# Patient Record
Sex: Male | Born: 1995 | Race: White | Hispanic: No | Marital: Married | Smoking: Current every day smoker
Health system: Southern US, Community
[De-identification: ages and names within clinical notes are randomized; demographics above are authoritative.]

---

## 2018-07-09 ENCOUNTER — Inpatient Hospital Stay (HOSPITAL_COMMUNITY): Payer: Medicaid Other

## 2018-07-09 ENCOUNTER — Emergency Department (HOSPITAL_COMMUNITY): Payer: Medicaid Other

## 2018-07-09 ENCOUNTER — Other Ambulatory Visit: Payer: Self-pay

## 2018-07-09 ENCOUNTER — Inpatient Hospital Stay (HOSPITAL_COMMUNITY)
Admission: EM | Admit: 2018-07-09 | Discharge: 2018-07-16 | DRG: 871 | Disposition: A | Discharge status: Home / Self Care | Payer: Medicaid Other | Attending: Internal Medicine | Admitting: Internal Medicine

## 2018-07-09 ENCOUNTER — Encounter (HOSPITAL_COMMUNITY): Payer: Self-pay

## 2018-07-09 DIAGNOSIS — J9600 Acute respiratory failure, unspecified whether with hypoxia or hypercapnia: Secondary | ICD-10-CM

## 2018-07-09 DIAGNOSIS — J9601 Acute respiratory failure with hypoxia: Secondary | ICD-10-CM | POA: Diagnosis present

## 2018-07-09 DIAGNOSIS — J9382 Other air leak: Secondary | ICD-10-CM | POA: Diagnosis present

## 2018-07-09 DIAGNOSIS — Z4682 Encounter for fitting and adjustment of non-vascular catheter: Secondary | ICD-10-CM | POA: Diagnosis not present

## 2018-07-09 DIAGNOSIS — R0602 Shortness of breath: Secondary | ICD-10-CM

## 2018-07-09 DIAGNOSIS — J690 Pneumonitis due to inhalation of food and vomit: Secondary | ICD-10-CM

## 2018-07-09 DIAGNOSIS — J939 Pneumothorax, unspecified: Secondary | ICD-10-CM

## 2018-07-09 DIAGNOSIS — J982 Interstitial emphysema: Secondary | ICD-10-CM | POA: Diagnosis present

## 2018-07-09 DIAGNOSIS — Q559 Congenital malformation of male genital organ, unspecified: Secondary | ICD-10-CM

## 2018-07-09 DIAGNOSIS — G92 Toxic encephalopathy: Secondary | ICD-10-CM | POA: Diagnosis present

## 2018-07-09 DIAGNOSIS — J9312 Secondary spontaneous pneumothorax: Secondary | ICD-10-CM | POA: Diagnosis present

## 2018-07-09 DIAGNOSIS — Z880 Allergy status to penicillin: Secondary | ICD-10-CM

## 2018-07-09 DIAGNOSIS — F17210 Nicotine dependence, cigarettes, uncomplicated: Secondary | ICD-10-CM | POA: Diagnosis present

## 2018-07-09 DIAGNOSIS — S270XXA Traumatic pneumothorax, initial encounter: Secondary | ICD-10-CM | POA: Diagnosis not present

## 2018-07-09 DIAGNOSIS — F0781 Postconcussional syndrome: Secondary | ICD-10-CM | POA: Diagnosis present

## 2018-07-09 DIAGNOSIS — Z20828 Contact with and (suspected) exposure to other viral communicable diseases: Secondary | ICD-10-CM | POA: Diagnosis present

## 2018-07-09 DIAGNOSIS — R6521 Severe sepsis with septic shock: Secondary | ICD-10-CM | POA: Diagnosis present

## 2018-07-09 DIAGNOSIS — A419 Sepsis, unspecified organism: Secondary | ICD-10-CM | POA: Diagnosis present

## 2018-07-09 DIAGNOSIS — R402332 Coma scale, best motor response, abnormal, at arrival to emergency department: Secondary | ICD-10-CM | POA: Diagnosis present

## 2018-07-09 DIAGNOSIS — F05 Delirium due to known physiological condition: Secondary | ICD-10-CM | POA: Diagnosis not present

## 2018-07-09 DIAGNOSIS — G40401 Other generalized epilepsy and epileptic syndromes, not intractable, with status epilepticus: Secondary | ICD-10-CM | POA: Diagnosis present

## 2018-07-09 DIAGNOSIS — J9811 Atelectasis: Secondary | ICD-10-CM

## 2018-07-09 DIAGNOSIS — R402112 Coma scale, eyes open, never, at arrival to emergency department: Secondary | ICD-10-CM | POA: Diagnosis present

## 2018-07-09 DIAGNOSIS — Z87820 Personal history of traumatic brain injury: Secondary | ICD-10-CM

## 2018-07-09 DIAGNOSIS — R402212 Coma scale, best verbal response, none, at arrival to emergency department: Secondary | ICD-10-CM | POA: Diagnosis present

## 2018-07-09 DIAGNOSIS — E876 Hypokalemia: Secondary | ICD-10-CM | POA: Diagnosis present

## 2018-07-09 DIAGNOSIS — R569 Unspecified convulsions: Secondary | ICD-10-CM | POA: Diagnosis present

## 2018-07-09 DIAGNOSIS — Z820 Family history of epilepsy and other diseases of the nervous system: Secondary | ICD-10-CM

## 2018-07-09 DIAGNOSIS — R739 Hyperglycemia, unspecified: Secondary | ICD-10-CM | POA: Diagnosis present

## 2018-07-09 DIAGNOSIS — G40901 Epilepsy, unspecified, not intractable, with status epilepticus: Secondary | ICD-10-CM | POA: Diagnosis present

## 2018-07-09 DIAGNOSIS — N5089 Other specified disorders of the male genital organs: Secondary | ICD-10-CM | POA: Diagnosis present

## 2018-07-09 DIAGNOSIS — J9383 Other pneumothorax: Secondary | ICD-10-CM

## 2018-07-09 DIAGNOSIS — N179 Acute kidney failure, unspecified: Secondary | ICD-10-CM | POA: Diagnosis present

## 2018-07-09 DIAGNOSIS — R4182 Altered mental status, unspecified: Secondary | ICD-10-CM

## 2018-07-09 DIAGNOSIS — J9311 Primary spontaneous pneumothorax: Secondary | ICD-10-CM | POA: Diagnosis not present

## 2018-07-09 DIAGNOSIS — Z23 Encounter for immunization: Secondary | ICD-10-CM

## 2018-07-09 DIAGNOSIS — Z781 Physical restraint status: Secondary | ICD-10-CM

## 2018-07-09 LAB — COMPREHENSIVE METABOLIC PANEL
ALT: 23 U/L (ref 0–44)
AST: 31 U/L (ref 15–41)
Albumin: 4.1 g/dL (ref 3.5–5.0)
Alkaline Phosphatase: 60 U/L (ref 38–126)
Anion gap: 20 — ABNORMAL HIGH (ref 5–15)
BUN: 18 mg/dL (ref 6–20)
CO2: 15 mmol/L — ABNORMAL LOW (ref 22–32)
Calcium: 8.8 mg/dL — ABNORMAL LOW (ref 8.9–10.3)
Chloride: 101 mmol/L (ref 98–111)
Creatinine, Ser: 1.37 mg/dL — ABNORMAL HIGH (ref 0.61–1.24)
GFR calc Af Amer: >60 mL/min (ref >60)
GFR calc non Af Amer: >60 mL/min (ref >60)
Glucose, Bld: 226 mg/dL — ABNORMAL HIGH (ref 70–99)
Potassium: 3.9 mmol/L (ref 3.5–5.1)
Sodium: 136 mmol/L (ref 135–145)
Total Bilirubin: 0.6 mg/dL (ref 0.3–1.2)
Total Protein: 6.6 g/dL (ref 6.5–8.1)

## 2018-07-09 LAB — POCT I-STAT 7, (LYTES, BLD GAS, ICA,H+H)
Acid-base deficit: 8 mmol/L — ABNORMAL HIGH (ref 0.0–2.0)
Acid-base deficit: 7 mmol/L — ABNORMAL HIGH (ref 0.0–2.0)
Acid-base deficit: 4 mmol/L — ABNORMAL HIGH (ref 0.0–2.0)
Acid-base deficit: 3 mmol/L — ABNORMAL HIGH (ref 0.0–2.0)
Acid-base deficit: 3 mmol/L — ABNORMAL HIGH (ref 0.0–2.0)
Bicarbonate: 20.7 mmol/L (ref 20.0–28.0)
Bicarbonate: 21.3 mmol/L (ref 20.0–28.0)
Bicarbonate: 23.1 mmol/L (ref 20.0–28.0)
Bicarbonate: 23 mmol/L (ref 20.0–28.0)
Bicarbonate: 22.9 mmol/L (ref 20.0–28.0)
Calcium, Ion: 1.18 mmol/L (ref 1.15–1.40)
Calcium, Ion: 1.17 mmol/L (ref 1.15–1.40)
Calcium, Ion: 1.13 mmol/L — ABNORMAL LOW (ref 1.15–1.40)
Calcium, Ion: 1.11 mmol/L — ABNORMAL LOW (ref 1.15–1.40)
Calcium, Ion: 1.11 mmol/L — ABNORMAL LOW (ref 1.15–1.40)
HCT: 42 % (ref 39.0–52.0)
HCT: 37 % — ABNORMAL LOW (ref 39.0–52.0)
HCT: 47 % (ref 39.0–52.0)
HCT: 45 % (ref 39.0–52.0)
HCT: 43 % (ref 39.0–52.0)
Hemoglobin: 14.3 g/dL (ref 13.0–17.0)
Hemoglobin: 12.6 g/dL — ABNORMAL LOW (ref 13.0–17.0)
Hemoglobin: 16 g/dL (ref 13.0–17.0)
Hemoglobin: 15.3 g/dL (ref 13.0–17.0)
Hemoglobin: 14.6 g/dL (ref 13.0–17.0)
O2 Saturation: 99 %
O2 Saturation: 78 %
O2 Saturation: 56 %
O2 Saturation: 84 %
O2 Saturation: 93 %
Patient temperature: 97.6
Patient temperature: 96.1
Patient temperature: 99.8
Patient temperature: 97.8
Patient temperature: 98.6
Potassium: 3.4 mmol/L — ABNORMAL LOW (ref 3.5–5.1)
Potassium: 3.3 mmol/L — ABNORMAL LOW (ref 3.5–5.1)
Potassium: 3.2 mmol/L — ABNORMAL LOW (ref 3.5–5.1)
Potassium: 3.2 mmol/L — ABNORMAL LOW (ref 3.5–5.1)
Potassium: 3.1 mmol/L — ABNORMAL LOW (ref 3.5–5.1)
Sodium: 137 mmol/L (ref 135–145)
Sodium: 139 mmol/L (ref 135–145)
Sodium: 136 mmol/L (ref 135–145)
Sodium: 136 mmol/L (ref 135–145)
Sodium: 136 mmol/L (ref 135–145)
TCO2: 22 mmol/L (ref 22–32)
TCO2: 23 mmol/L (ref 22–32)
TCO2: 25 mmol/L (ref 22–32)
TCO2: 24 mmol/L (ref 22–32)
TCO2: 24 mmol/L (ref 22–32)
pCO2 arterial: 50.2 mmHg — ABNORMAL HIGH (ref 32.0–48.0)
pCO2 arterial: 48.3 mmHg — ABNORMAL HIGH (ref 32.0–48.0)
pCO2 arterial: 50 mmHg — ABNORMAL HIGH (ref 32.0–48.0)
pCO2 arterial: 41.1 mmHg (ref 32.0–48.0)
pCO2 arterial: 42 mmHg (ref 32.0–48.0)
pH, Arterial: 7.22 — ABNORMAL LOW (ref 7.350–7.450)
pH, Arterial: 7.245 — ABNORMAL LOW (ref 7.350–7.450)
pH, Arterial: 7.276 — ABNORMAL LOW (ref 7.350–7.450)
pH, Arterial: 7.354 (ref 7.350–7.450)
pH, Arterial: 7.343 — ABNORMAL LOW (ref 7.350–7.450)
pO2, Arterial: 146 mmHg — ABNORMAL HIGH (ref 83.0–108.0)
pO2, Arterial: 46 mmHg — ABNORMAL LOW (ref 83.0–108.0)
pO2, Arterial: 35 mmHg — CL (ref 83.0–108.0)
pO2, Arterial: 49 mmHg — ABNORMAL LOW (ref 83.0–108.0)
pO2, Arterial: 70 mmHg — ABNORMAL LOW (ref 83.0–108.0)

## 2018-07-09 LAB — LACTIC ACID, PLASMA
Lactic Acid, Venous: 10.5 mmol/L (ref 0.5–1.9)
Lactic Acid, Venous: 1.7 mmol/L (ref 0.5–1.9)
Lactic Acid, Venous: 3.5 mmol/L (ref 0.5–1.9)
Lactic Acid, Venous: 3.6 mmol/L (ref 0.5–1.9)

## 2018-07-09 LAB — URINALYSIS, ROUTINE W REFLEX MICROSCOPIC
Bilirubin Urine: NEGATIVE
Glucose, UA: >=500 mg/dL — AB
Ketones, ur: NEGATIVE mg/dL
Leukocytes,Ua: NEGATIVE
Nitrite: NEGATIVE
Protein, ur: NEGATIVE mg/dL
Specific Gravity, Urine: 1.009 (ref 1.005–1.030)
pH: 5 (ref 5.0–8.0)

## 2018-07-09 LAB — GLUCOSE, CAPILLARY
Glucose-Capillary: 113 mg/dL — ABNORMAL HIGH (ref 70–99)
Glucose-Capillary: 122 mg/dL — ABNORMAL HIGH (ref 70–99)
Glucose-Capillary: 134 mg/dL — ABNORMAL HIGH (ref 70–99)

## 2018-07-09 LAB — RAPID URINE DRUG SCREEN, HOSP PERFORMED
Amphetamines: NOT DETECTED
Barbiturates: NOT DETECTED
Benzodiazepines: POSITIVE — AB
Cocaine: NOT DETECTED
Opiates: POSITIVE — AB
Tetrahydrocannabinol: POSITIVE — AB

## 2018-07-09 LAB — CBC
HCT: 46.9 % (ref 39.0–52.0)
Hemoglobin: 14.8 g/dL (ref 13.0–17.0)
MCH: 30.1 pg (ref 26.0–34.0)
MCHC: 31.6 g/dL (ref 30.0–36.0)
MCV: 95.5 fL (ref 80.0–100.0)
Platelets: 279 10*3/uL (ref 150–400)
RBC: 4.91 MIL/uL (ref 4.22–5.81)
RDW: 13 % (ref 11.5–15.5)
WBC: 10.3 10*3/uL (ref 4.0–10.5)
nRBC: 0 % (ref 0.0–0.2)

## 2018-07-09 LAB — TROPONIN I: Troponin I: 0.03 ng/mL (ref <0.03)

## 2018-07-09 LAB — BRAIN NATRIURETIC PEPTIDE: B Natriuretic Peptide: 31.4 pg/mL (ref 0.0–100.0)

## 2018-07-09 LAB — TRIGLYCERIDES: Triglycerides: 45 mg/dL (ref <150)

## 2018-07-09 LAB — CBG MONITORING, ED: Glucose-Capillary: 202 mg/dL — ABNORMAL HIGH (ref 70–99)

## 2018-07-09 LAB — MAGNESIUM: Magnesium: 2.1 mg/dL (ref 1.7–2.4)

## 2018-07-09 LAB — SARS CORONAVIRUS 2 BY RT PCR (HOSPITAL ORDER, PERFORMED IN ~~LOC~~ HOSPITAL LAB): SARS Coronavirus 2: NEGATIVE

## 2018-07-09 LAB — PHOSPHORUS: Phosphorus: 3.3 mg/dL (ref 2.5–4.6)

## 2018-07-09 LAB — PROCALCITONIN: Procalcitonin: 0.38 ng/mL

## 2018-07-09 LAB — CORTISOL: Cortisol, Plasma: 14.3 ug/dL

## 2018-07-09 LAB — ETHANOL: Alcohol, Ethyl (B): <10 mg/dL (ref <10)

## 2018-07-09 MED ORDER — VANCOMYCIN HCL IN DEXTROSE 1-5 GM/200ML-% IV SOLN
1000.0000 mg | Freq: Once | INTRAVENOUS | Status: AC
  Administered 2018-07-09: 1000 mg via INTRAVENOUS
  Filled 2018-07-09: qty 200

## 2018-07-09 MED ORDER — FENTANYL CITRATE (PF) 100 MCG/2ML IJ SOLN: 50.0000 ug | Freq: Once | INTRAMUSCULAR | Status: DC

## 2018-07-09 MED ORDER — PANTOPRAZOLE SODIUM 40 MG IV SOLR
40.0000 mg | Freq: Every day | INTRAVENOUS | Status: DC
  Administered 2018-07-09 – 2018-07-12 (×4): 40 mg via INTRAVENOUS
  Filled 2018-07-09 (×4): qty 40

## 2018-07-09 MED ORDER — SODIUM CHLORIDE 0.9 % IV SOLN
2000.0000 mg | Freq: Once | INTRAVENOUS | Status: AC
  Administered 2018-07-09: 2000 mg via INTRAVENOUS
  Filled 2018-07-09: qty 20

## 2018-07-09 MED ORDER — FENTANYL CITRATE (PF) 100 MCG/2ML IJ SOLN
INTRAMUSCULAR | Status: AC
  Filled 2018-07-09: qty 2

## 2018-07-09 MED ORDER — FENTANYL CITRATE (PF) 100 MCG/2ML IJ SOLN
100.0000 ug | INTRAMUSCULAR | Status: AC | PRN
  Administered 2018-07-09 (×3): 100 ug via INTRAVENOUS

## 2018-07-09 MED ORDER — INSULIN ASPART 100 UNIT/ML ~~LOC~~ SOLN
2.0000 [IU] | SUBCUTANEOUS | Status: DC
  Administered 2018-07-09: 2 [IU] via SUBCUTANEOUS

## 2018-07-09 MED ORDER — PROPOFOL 1000 MG/100ML IV EMUL
25.0000 ug/kg/min | INTRAVENOUS | Status: DC
  Administered 2018-07-09: 50 ug/kg/min via INTRAVENOUS
  Administered 2018-07-09: 60 ug/kg/min via INTRAVENOUS
  Administered 2018-07-10: 35 ug/kg/min via INTRAVENOUS
  Administered 2018-07-10: 50 ug/kg/min via INTRAVENOUS
  Administered 2018-07-10: 60 ug/kg/min via INTRAVENOUS
  Administered 2018-07-11: 70 ug/kg/min via INTRAVENOUS
  Administered 2018-07-11 (×2): 40 ug/kg/min via INTRAVENOUS
  Administered 2018-07-12 (×2): 65 ug/kg/min via INTRAVENOUS
  Filled 2018-07-09 (×13): qty 100

## 2018-07-09 MED ORDER — ACETAMINOPHEN 160 MG/5ML PO SOLN
650.0000 mg | ORAL | Status: DC | PRN
  Administered 2018-07-16: 650 mg
  Filled 2018-07-09: qty 20.3

## 2018-07-09 MED ORDER — SODIUM CHLORIDE 0.9 % IV SOLN: 0.0000 ug/kg/min | INTRAVENOUS | Status: DC

## 2018-07-09 MED ORDER — CISATRACURIUM BESYLATE (PF) 10 MG/5ML IV SOLN
0.1000 mg/kg | INTRAVENOUS | Status: DC | PRN
  Filled 2018-07-09 (×2): qty 3.2

## 2018-07-09 MED ORDER — CISATRACURIUM BOLUS VIA INFUSION: 5.0000 mg | Freq: Once | INTRAVENOUS | Status: DC

## 2018-07-09 MED ORDER — FENTANYL 2500MCG IN NS 250ML (10MCG/ML) PREMIX INFUSION
50.0000 ug/h | INTRAVENOUS | Status: DC
  Administered 2018-07-09: 200 ug/h via INTRAVENOUS
  Administered 2018-07-09 – 2018-07-12 (×7): 300 ug/h via INTRAVENOUS
  Filled 2018-07-09 (×8): qty 250

## 2018-07-09 MED ORDER — MIDAZOLAM HCL 2 MG/2ML IJ SOLN
INTRAMUSCULAR | Status: AC
  Filled 2018-07-09: qty 2

## 2018-07-09 MED ORDER — CHLORHEXIDINE GLUCONATE 0.12% ORAL RINSE (MEDLINE KIT)
15.0000 mL | Freq: Two times a day (BID) | OROMUCOSAL | Status: DC
  Administered 2018-07-09 – 2018-07-12 (×6): 15 mL via OROMUCOSAL

## 2018-07-09 MED ORDER — ATROPINE SULFATE 1 MG/10ML IJ SOSY
PREFILLED_SYRINGE | INTRAMUSCULAR | Status: AC
  Filled 2018-07-09: qty 10

## 2018-07-09 MED ORDER — MIDAZOLAM HCL 2 MG/2ML IJ SOLN
INTRAMUSCULAR | Status: AC
  Filled 2018-07-09: qty 6

## 2018-07-09 MED ORDER — PROPOFOL 1000 MG/100ML IV EMUL
0.0000 ug/kg/min | INTRAVENOUS | Status: DC
  Administered 2018-07-09: 10 ug/kg/min via INTRAVENOUS

## 2018-07-09 MED ORDER — MIDAZOLAM HCL 2 MG/2ML IJ SOLN
2.0000 mg | INTRAMUSCULAR | Status: DC | PRN
  Administered 2018-07-09: 2 mg via INTRAVENOUS
  Filled 2018-07-09 (×2): qty 2

## 2018-07-09 MED ORDER — FENTANYL BOLUS VIA INFUSION
50.0000 ug | INTRAVENOUS | Status: DC | PRN
  Filled 2018-07-09: qty 50

## 2018-07-09 MED ORDER — FENTANYL 2500MCG IN NS 250ML (10MCG/ML) PREMIX INFUSION: 50.0000 ug/h | INTRAVENOUS | Status: DC

## 2018-07-09 MED ORDER — ARTIFICIAL TEARS OPHTHALMIC OINT: 1.0000 "application " | TOPICAL_OINTMENT | Freq: Three times a day (TID) | OPHTHALMIC | Status: DC

## 2018-07-09 MED ORDER — SODIUM CHLORIDE 0.9 % IV SOLN
0.5000 ug/kg/min | INTRAVENOUS | Status: DC
  Administered 2018-07-09: 0.5 ug/kg/min via INTRAVENOUS
  Administered 2018-07-11: 2 ug/kg/min via INTRAVENOUS
  Filled 2018-07-09 (×2): qty 20

## 2018-07-09 MED ORDER — PROPOFOL 1000 MG/100ML IV EMUL: 25.0000 ug/kg/min | INTRAVENOUS | Status: DC

## 2018-07-09 MED ORDER — DEXTROSE 5 % IV SOLN
10.0000 mg/kg | Freq: Three times a day (TID) | INTRAVENOUS | Status: DC
  Administered 2018-07-09 – 2018-07-12 (×9): 635 mg via INTRAVENOUS
  Filled 2018-07-09 (×11): qty 12.7

## 2018-07-09 MED ORDER — SODIUM CHLORIDE 0.9 % IV BOLUS
1000.0000 mL | Freq: Once | INTRAVENOUS | Status: AC
  Administered 2018-07-09: 1000 mL via INTRAVENOUS

## 2018-07-09 MED ORDER — POTASSIUM CHLORIDE 10 MEQ/100ML IV SOLN
10.0000 meq | INTRAVENOUS | Status: AC
  Administered 2018-07-09 (×2): 10 meq via INTRAVENOUS
  Filled 2018-07-09 (×2): qty 100

## 2018-07-09 MED ORDER — SODIUM CHLORIDE 0.9 % IV SOLN
2.0000 g | Freq: Two times a day (BID) | INTRAVENOUS | Status: DC
  Administered 2018-07-10 – 2018-07-11 (×4): 2 g via INTRAVENOUS
  Filled 2018-07-09 (×4): qty 20

## 2018-07-09 MED ORDER — ROCURONIUM BROMIDE 50 MG/5ML IV SOLN
INTRAVENOUS | Status: AC | PRN
  Administered 2018-07-09: 75 mg via INTRAVENOUS

## 2018-07-09 MED ORDER — VALPROATE SODIUM 500 MG/5ML IV SOLN
2000.0000 mg | Freq: Once | INTRAVENOUS | Status: AC
  Administered 2018-07-09: 2000 mg via INTRAVENOUS
  Filled 2018-07-09: qty 20

## 2018-07-09 MED ORDER — ACETAMINOPHEN 325 MG PO TABS
650.0000 mg | ORAL_TABLET | ORAL | Status: DC | PRN
  Filled 2018-07-09: qty 2

## 2018-07-09 MED ORDER — SODIUM CHLORIDE 0.9 % IV SOLN
250.0000 mL | INTRAVENOUS | Status: DC
  Administered 2018-07-12: 05:00:00 250 mL via INTRAVENOUS

## 2018-07-09 MED ORDER — ONDANSETRON HCL 4 MG/2ML IJ SOLN
4.0000 mg | Freq: Four times a day (QID) | INTRAMUSCULAR | Status: DC | PRN
  Administered 2018-07-12: 10:00:00 4 mg via INTRAVENOUS
  Filled 2018-07-09: qty 2

## 2018-07-09 MED ORDER — FENTANYL CITRATE (PF) 100 MCG/2ML IJ SOLN: 100.0000 ug | INTRAMUSCULAR | Status: DC | PRN

## 2018-07-09 MED ORDER — ALBUTEROL SULFATE (2.5 MG/3ML) 0.083% IN NEBU: 2.5000 mg | INHALATION_SOLUTION | RESPIRATORY_TRACT | Status: DC | PRN

## 2018-07-09 MED ORDER — ORAL CARE MOUTH RINSE
15.0000 mL | OROMUCOSAL | Status: DC
  Administered 2018-07-09 – 2018-07-12 (×28): 15 mL via OROMUCOSAL

## 2018-07-09 MED ORDER — NOREPINEPHRINE 4 MG/250ML-% IV SOLN
INTRAVENOUS | Status: AC
  Administered 2018-07-09: 5 mg
  Filled 2018-07-09: qty 250

## 2018-07-09 MED ORDER — LEVETIRACETAM IN NACL 1000 MG/100ML IV SOLN
1000.0000 mg | Freq: Two times a day (BID) | INTRAVENOUS | Status: DC
  Administered 2018-07-09 – 2018-07-15 (×12): 1000 mg via INTRAVENOUS
  Filled 2018-07-09 (×12): qty 100

## 2018-07-09 MED ORDER — MIDAZOLAM HCL 2 MG/2ML IJ SOLN
2.0000 mg | INTRAMUSCULAR | Status: DC | PRN
  Administered 2018-07-09 (×2): 2 mg via INTRAVENOUS
  Filled 2018-07-09 (×2): qty 2

## 2018-07-09 MED ORDER — VANCOMYCIN HCL IN DEXTROSE 750-5 MG/150ML-% IV SOLN
750.0000 mg | Freq: Two times a day (BID) | INTRAVENOUS | Status: DC
  Administered 2018-07-10 – 2018-07-11 (×3): 750 mg via INTRAVENOUS
  Filled 2018-07-09 (×4): qty 150

## 2018-07-09 MED ORDER — FENTANYL BOLUS VIA INFUSION: 50.0000 ug | INTRAVENOUS | Status: DC | PRN

## 2018-07-09 MED ORDER — FUROSEMIDE 10 MG/ML IJ SOLN
40.0000 mg | Freq: Once | INTRAMUSCULAR | Status: AC
  Administered 2018-07-09: 12:00:00 40 mg via INTRAVENOUS
  Filled 2018-07-09: qty 4

## 2018-07-09 MED ORDER — ROCURONIUM BROMIDE 50 MG/5ML IV SOLN
INTRAVENOUS | Status: AC | PRN
  Administered 2018-07-09: 80 mg via INTRAVENOUS

## 2018-07-09 MED ORDER — SODIUM CHLORIDE 0.9 % IV SOLN
3.0000 g | Freq: Four times a day (QID) | INTRAVENOUS | Status: DC
  Filled 2018-07-09 (×4): qty 3

## 2018-07-09 MED ORDER — HEPARIN SODIUM (PORCINE) 5000 UNIT/ML IJ SOLN
5000.0000 [IU] | Freq: Three times a day (TID) | INTRAMUSCULAR | Status: DC
  Administered 2018-07-09 – 2018-07-13 (×10): 5000 [IU] via SUBCUTANEOUS
  Filled 2018-07-09 (×10): qty 1

## 2018-07-09 MED ORDER — SODIUM CHLORIDE 0.9 % IV SOLN: INTRAVENOUS | Status: DC | PRN

## 2018-07-09 MED ORDER — VALPROATE SODIUM 500 MG/5ML IV SOLN
350.0000 mg | Freq: Three times a day (TID) | INTRAVENOUS | Status: DC
  Administered 2018-07-10 – 2018-07-15 (×17): 350 mg via INTRAVENOUS
  Filled 2018-07-09 (×19): qty 3.5

## 2018-07-09 MED ORDER — ARTIFICIAL TEARS OPHTHALMIC OINT
1.0000 "application " | TOPICAL_OINTMENT | Freq: Three times a day (TID) | OPHTHALMIC | Status: DC
  Administered 2018-07-09 – 2018-07-11 (×6): 1 via OPHTHALMIC
  Filled 2018-07-09: qty 3.5

## 2018-07-09 MED ORDER — CISATRACURIUM BESYLATE (PF) 10 MG/5ML IV SOLN
10.0000 mg | Freq: Once | INTRAVENOUS | Status: AC
  Administered 2018-07-09: 10 mg via INTRAVENOUS

## 2018-07-09 MED ORDER — LORAZEPAM 2 MG/ML IJ SOLN
INTRAMUSCULAR | Status: AC
  Administered 2018-07-09: 1 mg
  Filled 2018-07-09: qty 1

## 2018-07-09 MED ORDER — FENTANYL CITRATE (PF) 100 MCG/2ML IJ SOLN
50.0000 ug | Freq: Once | INTRAMUSCULAR | Status: DC
  Filled 2018-07-09: qty 2

## 2018-07-09 MED ORDER — ROCURONIUM BROMIDE 50 MG/5ML IV SOLN
INTRAVENOUS | Status: AC | PRN
  Administered 2018-07-09: 50 mg via INTRAVENOUS

## 2018-07-09 MED ORDER — ETOMIDATE 2 MG/ML IV SOLN
INTRAVENOUS | Status: AC | PRN
  Administered 2018-07-09: 20 mg via INTRAVENOUS

## 2018-07-09 MED ORDER — NOREPINEPHRINE 4 MG/250ML-% IV SOLN
5.0000 ug/min | INTRAVENOUS | Status: DC
  Administered 2018-07-09: 23 ug/min via INTRAVENOUS
  Administered 2018-07-09: 18:00:00 26 ug/min via INTRAVENOUS
  Administered 2018-07-09: 25 ug/min via INTRAVENOUS
  Administered 2018-07-10: 11 ug/min via INTRAVENOUS
  Administered 2018-07-10: 4 ug/min via INTRAVENOUS
  Filled 2018-07-09 (×2): qty 250
  Filled 2018-07-09: qty 500
  Filled 2018-07-09: qty 250

## 2018-07-09 MED ORDER — MIDAZOLAM HCL 5 MG/5ML IJ SOLN
INTRAMUSCULAR | Status: AC | PRN
  Administered 2018-07-09: 5 mg via INTRAVENOUS

## 2018-07-09 MED ORDER — IOHEXOL 350 MG/ML SOLN
60.0000 mL | Freq: Once | INTRAVENOUS | Status: AC | PRN
  Administered 2018-07-09: 60 mL via INTRAVENOUS

## 2018-07-09 NOTE — Progress Notes (Signed)
Attempted to get patient this AM as EEG was ordered STAT by Dr. Otelia Limes however pt was unavailable due to chest tube being inserted and then central lines being placed. Went to get another pt's EEG, came back to pt's room and lines are still being placed however RN said it would only be a few more minutes then pt is being transferred to 4N31. Per Dr. Otelia Limes this pt is priority so I'm setting up in pt's new room and awaiting arrival.

## 2018-07-09 NOTE — ED Notes (Signed)
Pt's HR fell down to 49, Zoll pads in place. Pt still has strong femoral pulse. ICU MD in room. ICU MD to use bedside US.

## 2018-07-09 NOTE — Procedures (Signed)
Chest Tube Insertion Procedure Note  Indications:  Clinically significant right Pneumothorax  Pre-operative Diagnosis: Right Pneumothorax  Post-operative Diagnosis: Right Pneumothorax  Procedure Details  Informed consent was obtained for the procedure, including sedation.  Risks of lung perforation, hemorrhage, arrhythmia, and adverse drug reaction were discussed.   After sterile skin prep, using standard technique, a 28 French tube was placed in the right lateral 5 rib space.  Findings: Gush of air with air leak in chest  Estimated Blood Loss:  Minimal         Specimens:  None              Complications:  None; patient tolerated the procedure well.         Disposition: ICU - intubated and critically ill.         Condition: stable  Chilton Greathouse MD [facility] Pager [phone] If no answer call [phone] 07/09/2018, 3:34 PM
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
## 2018-07-09 NOTE — ED Notes (Signed)
ED TO INPATIENT HANDOFF REPORT  ED Nurse Name and Phone #: Caryn Bee RN  S Name/Age/Gender Chris Carter 22 y.o. male Room/Bed: OTFC/OTF  Code Status   Code Status: Full Code  Home/SNF/Other Home {Patient oriented to: intubated Is this baseline? 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 4 total witnessed seizures by the pt's coworker and EMS. EMS gave  of IM midazolam and  of IV midazolam in route. Pt appears to be in status epilepticus upon arrival. Snoring respirations, being bagged. HR 100, BP 140/80, spo2 92% with bvm, CBG 115. Pt had opa and 2 NPAs placed, then was intubated, then the pt was extubated in route. NPAs replaced and being bagged upon arrival here. Unknown if pt has hx of seizures. The pt's coworker stated that this was their first day working together. Pt was incontinent, no tongue trauma noted.    Allergies Allergies  Allergen Reactions  . Penicillins Other (See Comments)    Pt does not know reaction     Level of Care/Admitting Diagnosis ED Disposition    ED Disposition Condition Comment   Admit  Hospital Area: [facility] [[id number]]  Level of Care: ICU [6]  Covid Evaluation: Confirmed COVID Negative  Diagnosis: Status epilepticus [facility]) [[id number]]  Admitting Physician: Chilton Greathouse [[id number]]  Attending Physician: Chilton Greathouse [[id number]]  Estimated length of stay: 3 - 4 days  Certification:: I certify this patient will need inpatient services for at least 2 midnights  PT Class (Do Not Modify): Inpatient [101]  PT Acc Code (Do Not Modify): Private [1]       B Medical/Surgery History History reviewed. No pertinent past medical history. History reviewed. No pertinent surgical history.   A IV  Location/Drains/Wounds Patient Lines/Drains/Airways Status   Active Line/Drains/Airways    Name:   Placement date:   Placement time:   Site:   Days:   Peripheral IV 07/09/18 Left Antecubital   07/09/18    0921    Antecubital   less than 1   Peripheral IV 07/09/18 Right Antecubital   07/09/18    1101    Antecubital   less than 1   CVC Triple Lumen 07/09/18 Left External jugular   07/09/18    1250     less than 1   NG/OG Tube Orogastric 18 Fr. Center mouth Xray;Confirmed by Surgical Manipulation;Aucultation   07/09/18    2956    Center mouth   less than 1   Airway 7.5 mm   07/09/18    0920     less than 1          Intake/Output Last 24 hours  Intake/Output Summary (Last 24 hours) at 07/09/2018 1335 Last data filed at 07/09/2018 1130 Gross per 24 hour  Intake 2120 ml  Output -  Net 2120 ml    Labs/Imaging Results for orders placed or performed during the hospital encounter of 07/09/18 (from the past 48 hour(s))  SARS Coronavirus 2 (CEPHEID - Performed in [facility] hospital lab), Hosp Order     Status: None   Collection Time: 07/09/18  9:29 AM  Result Value Ref Range   SARS Coronavirus 2 NEGATIVE NEGATIVE    Comment: (NOTE) If result is NEGATIVE SARS-CoV-2 target nucleic acids  are NOT DETECTED. The SARS-CoV-2 RNA is generally detectable in upper and lower  respiratory specimens during the acute phase of infection. The lowest  concentration of SARS-CoV-2 viral copies this assay can detect is 250  copies / mL. A negative result does not preclude SARS-CoV-2 infection  and should not be used as the sole basis for treatment or other  patient management decisions.  A negative result may occur with  improper specimen collection / handling, submission of specimen other  than nasopharyngeal swab, presence of viral mutation(s) within the  areas targeted by this assay, and inadequate number of viral copies  (<250 copies / mL). A negative result must be combined with clinical   observations, patient history, and epidemiological information. If result is POSITIVE SARS-CoV-2 target nucleic acids are DETECTED. The SARS-CoV-2 RNA is generally detectable in upper and lower  respiratory specimens dur ing the acute phase of infection.  Positive  results are indicative of active infection with SARS-CoV-2.  Clinical  correlation with patient history and other diagnostic information is  necessary to determine patient infection status.  Positive results do  not rule out bacterial infection or co-infection with other viruses. If result is PRESUMPTIVE POSTIVE SARS-CoV-2 nucleic acids MAY BE PRESENT.   A presumptive positive result was obtained on the submitted specimen  and confirmed on repeat testing.  While 2019 novel coronavirus  (SARS-CoV-2) nucleic acids may be present in the submitted sample  additional confirmatory testing may be necessary for epidemiological  and / or clinical management purposes  to differentiate between  SARS-CoV-2 and other Sarbecovirus currently known to infect humans.  If clinically indicated additional testing with an alternate test  methodology [phone]) is advised. The SARS-CoV-2 RNA is generally  detectable in upper and lower respiratory sp ecimens during the acute  phase of infection. The expected result is Negative. Fact Sheet for Patients:  BoilerBrush.com.cy Fact Sheet for Healthcare Providers: https://pope.com/ This test is not yet approved or cleared by the [place] FDA and has been authorized for detection and/or diagnosis of SARS-CoV-2 by FDA under an Emergency Use Authorization (EUA).  This EUA will remain in effect (meaning this test can be used) for the duration of the COVID-19 declaration under Section 564(b)(1) of the Act, 21 U.S.C. section 360bbb-3(b)(1), unless the authorization is terminated or revoked sooner. Performed at [facility], [address]  [place], [place] [address]   CBG monitoring, ED     Status: Abnormal   Collection Time: 07/09/18  9:34 AM  Result Value Ref Range   Glucose-Capillary 202 (H) 70 - 99 mg/dL   Comment 1 Notify RN    Comment 2 Document in Chart   Rapid urine drug screen (hospital performed)     Status: Abnormal   Collection Time: 07/09/18  9:44 AM  Result Value Ref Range   Opiates POSITIVE (A) NONE DETECTED   Cocaine NONE DETECTED NONE DETECTED   Benzodiazepines POSITIVE (A) NONE DETECTED   Amphetamines NONE DETECTED NONE DETECTED   Tetrahydrocannabinol POSITIVE (A) NONE DETECTED   Barbiturates NONE DETECTED NONE DETECTED    Comment: (NOTE) DRUG SCREEN FOR MEDICAL PURPOSES ONLY.  IF CONFIRMATION IS NEEDED FOR ANY PURPOSE, NOTIFY LAB WITHIN 5 DAYS. LOWEST DETECTABLE LIMITS FOR URINE DRUG SCREEN Drug Class                     Cutoff (ng/mL) Amphetamine and metabolites    1000 Barbiturate and metabolites    200 Benzodiazepine  200 Tricyclics and metabolites     300 Opiates and metabolites        300 Cocaine and metabolites        300 THC                            50 Performed at [facility], [address], [place], [place] [address]   Ethanol     Status: None   Collection Time: 07/09/18  9:54 AM  Result Value Ref Range   Alcohol, Ethyl (B) <10 <10 mg/dL    Comment: (NOTE) Lowest detectable limit for serum alcohol is 10 mg/dL. For medical purposes only. Performed at [facility], [address] [place], [place] [address]   Comprehensive metabolic panel     Status: Abnormal   Collection Time: 07/09/18  9:55 AM  Result Value Ref Range   Sodium 136 135 - 145 mmol/L   Potassium 3.9 3.5 - 5.1 mmol/L   Chloride 101 98 - 111 mmol/L   CO2 15 (L) 22 - 32 mmol/L   Glucose, Bld 226 (H) 70 - 99 mg/dL   BUN 18 6 - 20 mg/dL   Creatinine, Ser 8.29 (H) 0.61 - 1.24 mg/dL   Calcium 8.8 (L) 8.9 - 10.3 mg/dL   Total Protein 6.6 6.5 - 8.1 g/dL   Albumin 4.1 3.5 - 5.0 g/dL   AST  31 15 - 41 U/L   ALT 23 0 - 44 U/L   Alkaline Phosphatase 60 38 - 126 U/L   Total Bilirubin 0.6 0.3 - 1.2 mg/dL   GFR calc non Af Amer >60 >60 mL/min   GFR calc Af Amer >60 >60 mL/min   Anion gap 20 (H) 5 - 15    Comment: Performed at [facility], [address] [place], [place] [address]  CBC     Status: None   Collection Time: 07/09/18  9:55 AM  Result Value Ref Range   WBC 10.3 4.0 - 10.5 K/uL   RBC 4.91 4.22 - 5.81 MIL/uL   Hemoglobin 14.8 13.0 - 17.0 g/dL   HCT 08.6 57.8 - 46.9 %   MCV 95.5 80.0 - 100.0 fL   MCH 30.1 26.0 - 34.0 pg   MCHC 31.6 30.0 - 36.0 g/dL   RDW 62.9 52.8 - 41.3 %   Platelets 279 150 - 400 K/uL   nRBC 0.0 0.0 - 0.2 %    Comment: Performed at [facility], [address], [place], [place] [address]  Triglycerides     Status: None   Collection Time: 07/09/18  9:55 AM  Result Value Ref Range   Triglycerides 45 <150 mg/dL    Comment: Performed at [facility], [address] [place], [place] [address]  I-STAT 7, (LYTES, BLD GAS, ICA, H+H)     Status: Abnormal   Collection Time: 07/09/18 10:03 AM  Result Value Ref Range   pH, Arterial 7.220 (L) 7.350 - 7.450   pCO2 arterial 50.2 (H) 32.0 - 48.0 mmHg   pO2, Arterial 146.0 (H) 83.0 - 108.0 mmHg   Bicarbonate 20.7 20.0 - 28.0 mmol/L   TCO2 22 22 - 32 mmol/L   O2 Saturation 99.0 %   Acid-base deficit 8.0 (H) 0.0 - 2.0 mmol/L   Sodium 137 135 - 145 mmol/L   Potassium 3.4 (L) 3.5 - 5.1 mmol/L   Calcium, Ion 1.18 1.15 - 1.40 mmol/L  HCT 42.0 39.0 - 52.0 %   Hemoglobin 14.3 13.0 - 17.0 g/dL   Patient temperature 53.6 F    Collection site RADIAL, ALLEN'S TEST ACCEPTABLE    Drawn by Operator    Sample type ARTERIAL   Lactic acid, plasma     Status: Abnormal   Collection Time: 07/09/18 10:50 AM  Result Value Ref Range   Lactic Acid, Venous 10.5 (HH) 0.5 - 1.9 mmol/L    Comment: CRITICAL RESULT CALLED TO, READ BACK BY AND VERIFIED WITH: K.Kasra Melvin,RN @ 1128 07/09/2018 WEBBERJ Performed at  [facility], [address] [place], [place] [address]   I-STAT 7, (LYTES, BLD GAS, ICA, H+H)     Status: Abnormal   Collection Time: 07/09/18 11:32 AM  Result Value Ref Range   pH, Arterial 7.245 (L) 7.350 - 7.450   pCO2 arterial 48.3 (H) 32.0 - 48.0 mmHg   pO2, Arterial 46.0 (L) 83.0 - 108.0 mmHg   Bicarbonate 21.3 20.0 - 28.0 mmol/L   TCO2 23 22 - 32 mmol/L   O2 Saturation 78.0 %   Acid-base deficit 7.0 (H) 0.0 - 2.0 mmol/L   Sodium 139 135 - 145 mmol/L   Potassium 3.3 (L) 3.5 - 5.1 mmol/L   Calcium, Ion 1.17 1.15 - 1.40 mmol/L   HCT 37.0 (L) 39.0 - 52.0 %   Hemoglobin 12.6 (L) 13.0 - 17.0 g/dL   Patient temperature 47.4 F    Collection site RADIAL, ALLEN'S TEST ACCEPTABLE    Drawn by Operator    Sample type ARTERIAL    Ct Head Wo Contrast  Result Date: 07/09/2018 CLINICAL DATA:  Seizure activity, new EXAM: CT HEAD WITHOUT CONTRAST TECHNIQUE: Contiguous axial images were obtained from the base of the skull through the vertex without intravenous contrast. COMPARISON:  None. FINDINGS: Brain: No evidence of acute infarction, hemorrhage, hydrocephalus, extra-axial collection or mass lesion/mass effect. Vascular: No hyperdense vessel or unexpected calcification. Skull: Normal. Negative for fracture or focal lesion. Sinuses/Orbits: The visualized paranasal sinuses are essentially clear. The mastoid air cells are unopacified. Other: None. IMPRESSION: Normal head CT. Electronically Signed   By: Charline Bills M.D.   On: 07/09/2018 10:30   Dg Chest Portable 1 View  Result Date: 07/09/2018 CLINICAL DATA:  Respiratory difficulty EXAM: PORTABLE CHEST 1 VIEW COMPARISON:  Today 924 hours FINDINGS: NG tube tip is now beyond the gastroesophageal junction. Endotracheal tube is stable. There is now linear lucency along the right side of the mediastinum extending towards the right supraclavicular region worrisome for pneumomediastinum. There is subcutaneous emphysema in the right side of the  neck. No definite pneumothorax can be visualized. Airspace disease throughout the left lung is worse. Central right airspace disease has developed. IMPRESSION: There is now emphysema in the right neck as well as findings suggesting pneumomediastinum. No definite pneumothorax is identified. Endotracheal tube is stable. NG tube tip is now in the stomach Worsening airspace disease throughout the left lung. New central right lung airspace disease. Electronically Signed   By: Jolaine Click M.D.   On: 07/09/2018 11:47   Dg Chest Port 1 View  Result Date: 07/09/2018 CLINICAL DATA:  Endotracheal tube insertion EXAM: PORTABLE CHEST 1 VIEW COMPARISON:  None. FINDINGS: Endotracheal tube tip is 4.2 cm from the carina. The NG tube is coiled in the proximal esophagus. Hazy airspace disease at the left lung base. Right lung is clear. Normal heart size. No pneumothorax. IMPRESSION: Endotracheal tube tip is 4.2 cm from the carina. NG tube is  coiled in the upper esophagus. Hazy left basilar airspace disease. Electronically Signed   By: Jolaine ClickArthur  Hoss M.D.   On: 07/09/2018 10:03   Dg Abd Portable 1 View  Result Date: 07/09/2018 CLINICAL DATA:  NG tube EXAM: PORTABLE ABDOMEN - 1 VIEW COMPARISON:  None. FINDINGS: NG tube tip is in the body of the stomach. There are no disproportionally dilated loops of bowel. There is no obvious free intraperitoneal gas. IMPRESSION: NG tube tip is in the body of the stomach. Electronically Signed   By: Jolaine ClickArthur  Hoss M.D.   On: 07/09/2018 10:08    Pending Labs Unresulted Labs (From admission, onward)    Start     Ordered   07/10/18 0500  CBC  Tomorrow morning,   R     07/09/18 1224   07/10/18 0500  Basic metabolic panel  Tomorrow morning,   R     07/09/18 1224   07/10/18 0500  Blood gas, arterial  Tomorrow morning,   R     07/09/18 1224   07/10/18 0500  Magnesium  Tomorrow morning,   R     07/09/18 1224   07/10/18 0500  Phosphorus  Tomorrow morning,   R     07/09/18 1224   07/10/18  0500  Procalcitonin  Daily,   R     07/09/18 1232   07/10/18 0500  Triglycerides  (propofol (DIPRIVAN))  Daily,   R    Comments:  While on propofol (DIPRIVAN)    07/09/18 1239   07/09/18 1243  Blood gas, arterial  Once,   R     07/09/18 1243   07/09/18 1217  Troponin I - Once  Once,   R     07/09/18 1224   07/09/18 1214  Magnesium  Once,   R     07/09/18 1224   07/09/18 1214  Phosphorus  Once,   R     07/09/18 1224   07/09/18 1214  Lactic acid, plasma  STAT Now then every 3 hours,   R     07/09/18 1224   07/09/18 1214  Procalcitonin  Once,   R     07/09/18 1224   07/09/18 1214  Brain natriuretic peptide  Once,   R     07/09/18 1224   07/09/18 1214  Cortisol  ONCE - STAT,   STAT     07/09/18 1224   07/09/18 1214  Culture, blood (routine x 2)  BLOOD CULTURE X 2,   R     07/09/18 1224   07/09/18 1214  Culture, respiratory (tracheal aspirate)  Once,   R     07/09/18 1224   07/09/18 1214  Urinalysis, Routine w reflex microscopic  Once,   R     07/09/18 1224   07/09/18 1214  Strep pneumoniae urinary antigen  ([facility])  Once,   R     07/09/18 1224   07/09/18 1214  Legionella Pneumophila Serogp 1 Ur Ag  Once,   R     07/09/18 1224   07/09/18 1212  HIV antibody (Routine Testing)  Once,   R     07/09/18 1224   07/09/18 1212  CBC  (heparin)  Once,   R    Comments:  Baseline for heparin therapy IF NOT ALREADY DRAWN.  Notify MD if PLT < 100 K.    07/09/18 1224   07/09/18 1212  Creatinine, serum  (heparin)  Once,   R    Comments:  Baseline for heparin therapy IF NOT ALREADY DRAWN.    07/09/18 1224   07/09/18 1050  Lactic acid, plasma  Now then every 2 hours,   STAT     07/09/18 1049   07/09/18 0925  Triglycerides  (propofol (DIPRIVAN))  Every 72 hours,   R    Comments:  While on propofol (DIPRIVAN)    07/09/18 0924          Vitals/Pain Today's Vitals   07/09/18 1205 07/09/18 1209 07/09/18 1245 07/09/18 1315  BP: (!) 165/98  (!) 143/86 (!) 151/87  Pulse: (!) 123 70 82  (!) 108  Resp: (!) 25 (!) 27  20  Temp:      TempSrc:      SpO2: (!) 84% (!) 81% (!) 87% (!) 83%  Weight:      Height:      PainSc:        Isolation Precautions No active isolations  Medications Medications  fentaNYL (SUBLIMAZE) injection 100 mcg (100 mcg Intravenous Given 07/09/18 1115)  fentaNYL (SUBLIMAZE) injection 100 mcg (has no administration in time range)  midazolam (VERSED) injection 2 mg (2 mg Intravenous Given 07/09/18 1227)  midazolam (VERSED) injection 2 mg (has no administration in time range)  fentaNYL (SUBLIMAZE) 100 MCG/2ML injection ( Intravenous Not Given 07/09/18 1100)  fentaNYL (SUBLIMAZE) 100 MCG/2ML injection (has no administration in time range)  midazolam (VERSED) 2 MG/2ML injection (has no administration in time range)  levETIRAcetam (KEPPRA) IVPB 1000 mg/100 mL premix (has no administration in time range)  heparin injection 5,000 Units (has no administration in time range)  pantoprazole (PROTONIX) injection 40 mg (has no administration in time range)  acetaminophen (TYLENOL) tablet 650 mg (has no administration in time range)  ondansetron (ZOFRAN) injection 4 mg (has no administration in time range)  0.9 %  sodium chloride infusion (has no administration in time range)  norepinephrine (LEVOPHED) 4mg  in premix infusion (has no administration in time range)  albuterol (PROVENTIL) (2.5 MG/3ML) 0.083% nebulizer solution 2.5 mg (has no administration in time range)  cefTRIAXone (ROCEPHIN) 2 g in sodium chloride 0.9 % 100 mL IVPB (has no administration in time range)  midazolam (VERSED) 2 MG/2ML injection (has no administration in time range)  potassium chloride 10 mEq in 100 mL IVPB (has no administration in time range)  vancomycin (VANCOCIN) IVPB 1000 mg/200 mL premix (has no administration in time range)  acyclovir (ZOVIRAX) 635 mg in dextrose 5 % 100 mL IVPB (has no administration in time range)  Ampicillin-Sulbactam (UNASYN) 3 g in sodium chloride 0.9  % 100 mL IVPB (0 g Intravenous Hold 07/09/18 1313)  insulin aspart (novoLOG) injection 2-6 Units (has no administration in time range)  vancomycin (VANCOCIN) IVPB 750 mg/150 ml premix (has no administration in time range)  artificial tears (LACRILUBE) ophthalmic ointment 1 application (has no administration in time range)  fentaNYL (SUBLIMAZE) injection 50 mcg (has no administration in time range)  fentaNYL in NS (40mcg/ml) infusion-PREMIX (has no administration in time range)  fentaNYL (SUBLIMAZE) bolus via infusion 50 mcg (has no administration in time range)  propofol (DIPRIVAN) 1000 MG/100ML infusion (has no administration in time range)  cisatracurium (NIMBEX) injection 6.4 mg (has no administration in time range)  0.9 %  sodium chloride infusion (has no administration in time range)  rocuronium (ZEMURON) injection (80 mg Intravenous Given 07/09/18 1232)  levETIRAcetam (KEPPRA) 2,000 mg in sodium chloride 0.9 % 100 mL IVPB (0 mg Intravenous Stopped 07/09/18 1008)  sodium  chloride 0.9 % bolus 1,000 mL (0 mLs Intravenous Stopped 07/09/18 0956)  etomidate (AMIDATE) injection (20 mg Intravenous Given 07/09/18 0917)  rocuronium (ZEMURON) injection (75 mg Intravenous Given 07/09/18 0918)  LORazepam (ATIVAN) 2 MG/ML injection (1 mg  Given 07/09/18 1045)  rocuronium (ZEMURON) injection (50 mg Intravenous Given 07/09/18 1116)  midazolam (VERSED) 5 MG/5ML injection (5 mg Intravenous Given 07/09/18 1118)  norepinephrine (LEVOPHED) 4-5 MG/250ML-% infusion SOLN (10 mcg/kg/min  Rate/Dose Change 07/09/18 1140)  sodium chloride 0.9 % bolus 1,000 mL (0 mLs Intravenous Stopped 07/09/18 1130)  furosemide (LASIX) injection 40 mg (40 mg Intravenous Given 07/09/18 1217)    Mobility non-ambulatory High fall risk   Focused Assessments Cardiac Assessment Handoff:  Cardiac Rhythm: Normal sinus rhythm No results found for: CKTOTAL, CKMB, CKMBINDEX, TROPONINI No results found for: DDIMER Does the Patient  currently have chest pain? No   , Neuro Assessment Handoff:  Swallow screen pass? Yes  Cardiac Rhythm: Normal sinus rhythm       Neuro Assessment: Exceptions to WDL Neuro Checks:      Last Documented NIHSS Modified Score:   Has TPA been given? No If patient is a Neuro Trauma and patient is going to OR before floor call report to 4N Charge nurse: [phone] or [phone]  , Pulmonary Assessment Handoff:  Lung sounds: Bilateral Breath Sounds: Clear L Breath Sounds: Rhonchi R Breath Sounds: Rhonchi O2 Device: Ventilator        R Recommendations: See Admitting Provider Note  Report given to: 4N RN  Additional Notes: see triage note

## 2018-07-09 NOTE — ED Notes (Addendum)
Pt starting to move, unable to tell if it is seizure activity or agitation. Pt's BP dropped to 90/64 then to 78 systolic, Propofol stopped. Dr Rhunette Croft in room, giving order for soft restraints. 1 L bolus started. 1mg  of ativan ordered and given.

## 2018-07-09 NOTE — ED Notes (Signed)
RT attempting arterial line. Pt began moving. ICU MD notified and ordered to restart propofol and leave levophed at .

## 2018-07-09 NOTE — Progress Notes (Signed)
ABG's serially improving.  Wife called, and updated regarding critical condition, placement of chest tubes for PTX

## 2018-07-09 NOTE — ED Notes (Signed)
Pt violently trying to pull at tubes. 2 RNs, RT and 1 EMT restraining pt and pt in soft wrist and ankle restraints. fentanyl given, now pt is calm.

## 2018-07-09 NOTE — ED Notes (Addendum)
Pt's wife came back to visit pt briefly prior to going to CT and upstairs. She told this RN that no one is allowed have  Information about the pt except her. The pt's mother called to speak with this RN to get an update but wife refused.  She will set up a pass code with RN upstairs after patient  Goes up and 4N RN aware of this. This RN escorting pt to CT and then to 4N at this time. Pt is on 50 propofol and still on 10 of levo. Blood cultures were drawn and this RN was about to start ampicillen but wife stated that he is allergic to all cillens. Not given, no other antibiotics available to give at this time.
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
## 2018-07-09 NOTE — ED Notes (Signed)
Dr Rhunette Croft spoke with pt's wife. She states that he has no hx of seizures. States that the pt had a headache yesterday and that is abnormal for him. He took a vicodin.

## 2018-07-09 NOTE — ED Triage Notes (Addendum)
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 4 total witnessed seizures by the pt's coworker and EMS. EMS gave 5mg  of IM midazolam and 5mg  of IV midazolam in route. Pt appears to be in status epilepticus upon arrival. Snoring respirations, being bagged. HR 100, BP 140/80, spo2 92% with bvm, CBG 115. Pt had opa and 2 NPAs placed, then was intubated, then the pt was extubated in route. NPAs replaced and being bagged upon arrival here. Unknown if pt has hx of seizures. The pt's coworker stated that this was their first day working together. Pt was incontinent, no tongue trauma noted.

## 2018-07-09 NOTE — Progress Notes (Signed)
Spoke with radiologist re: most recent CXR. L PTX still fairly persistent over hemidiaphragm;  apical component of PTX appears improved. L lung still quite consolidated.  As pt's ABG and SaO2 have continued to improve, and he remains hemodynamically stable, will  see if PEEP re-recruits lung and f/u in a.m. with repeat CXR. IF not, may require bronch w/ lavage and/or placement of 2nd chest tube.

## 2018-07-09 NOTE — Progress Notes (Signed)
rEEG turned LTM - no initial skin breakdown

## 2018-07-09 NOTE — Progress Notes (Addendum)
LTM EEG reviewed through 5:15 PM. After valproic acid load was completed 3:54 PM, no further episodes of evolving rhythmic delta activity suspicious for seizures are seen. There is prominent high amplitude fast beta activity throughout the record, most likely due to benzodiazepine effect.    A/R: 23 year old male presenting with status epilepticus after onset of headache at home - Continue scheduled Keppra and valproic acid - Frequent neuro checks - Continue empiric acyclovir, vancomycin and Rocephin - Continue LTM EEG - May be able to obtain MRI brain tomorrow if LTM EEG continues to show no seizure recurrence - No change to other recommendations made in my initial consult note.   Additional 10 minutes of critical care time spent in LTM EEG review  Electronically signed: Dr. Caryl Pina

## 2018-07-09 NOTE — Progress Notes (Signed)
RT note: RT and RN transported patient to CT and back to ED on ventilator. Vital signs stable through out.

## 2018-07-09 NOTE — ED Notes (Signed)
Pt now calm after med admin. ICU, Neuro at bedside. spo2 at 78%

## 2018-07-09 NOTE — Progress Notes (Signed)
EEG complete - results pending 

## 2018-07-09 NOTE — ED Notes (Addendum)
Central Line cart at bedside for placement by ICU MD. Holding off on LP right now due to low spo2 saturation. Pt will get central line, then go to ICU and to CT angio on the way.

## 2018-07-09 NOTE — Progress Notes (Signed)
Critical ABG values given to Dr. Ollen Bowl.

## 2018-07-09 NOTE — Procedures (Signed)
Central Venous Catheter Insertion Procedure Note WESTAN DRESEN [id number] [birth year]  Procedure: Insertion of Central Venous Catheter Indications: Drug and/or fluid administration  Procedure Details Consent: Risks of procedure as well as the alternatives and risks of each were explained to the (patient/caregiver).  Consent for procedure obtained. Time Out: Verified patient identification, verified procedure, site/side was marked, verified correct patient position, special equipment/implants available, medications/allergies/relevent history reviewed, required imaging and test results available.  Performed  Maximum sterile technique was used including antiseptics, cap, gloves, hand hygiene, mask and sheet. Skin prep: Chlorhexidine; local anesthetic administered A antimicrobial bonded/coated triple lumen catheter was placed in the left internal jugular vein using the Seldinger technique.  Evaluation Blood flow good Complications: No apparent complications Patient did tolerate procedure well. Chest X-ray ordered to verify placement.  CXR: pending.  Ceylon Arenson 07/09/2018, 3:28 PM

## 2018-07-09 NOTE — ED Notes (Signed)
Pt transported to CT by this RN and RT  
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
## 2018-07-09 NOTE — Progress Notes (Signed)
Pharmacy Antibiotic Note  Chris Carter is a 23 y.o. male admitted on 07/09/2018 with seizures. Pharmacy has been consulted for vancomycin, unasyn and acyclovir dosing. Pt is afebrile and WBC is WNL. SCr is elevated at 1.37 but unknown baseline at this time. Lactic acid is elevated  Plan: Vancomycin 1gm IV x 1 then 750mg  IV Q12H Unasyn 3gm IV Q6H Acyclovir 10mg /kg IV Q8H F/u renal fxn, C&S, clinical status and peak/trough at SS  Height: 5\' 10"  (177.8 cm) Weight: 140 lb (63.5 kg) IBW/kg (Calculated) : 73  Temp (24hrs), Avg:96.1 F (35.6 C), Min:96.1 F (35.6 C), Max:96.1 F (35.6 C)  Recent Labs  Lab 07/09/18 0955 07/09/18 1050  WBC 10.3  --   CREATININE 1.37*  --   LATICACIDVEN  --  10.5*    Estimated Creatinine Clearance: 77.3 mL/min (A) (by C-G formula based on SCr of 1.37 mg/dL (H)).    Allergies not on file  Antimicrobials this admission: Vanc 5/30>> CTX 5/30>> Acyclovir 5/30>> Unasyn 5/30>>  Dose adjustments this admission: N/A  Microbiology results: Pending  Thank you for allowing pharmacy to be a part of this patient's care.  Chris Carter, Drake Leach 07/09/2018 12:34 PM

## 2018-07-09 NOTE — Procedures (Signed)
Arterial Catheter Insertion Procedure Note ZALMAN GANSER [id number] [birth year]  Procedure: Insertion of Arterial Catheter  Indications: Blood pressure monitoring and Frequent blood sampling  Procedure Details Consent: Unable to obtain consent because of emergent medical necessity. Time Out: Verified patient identification, verified procedure, site/side was marked, verified correct patient position, special equipment/implants available, medications/allergies/relevent history reviewed, required imaging and test results available.  Performed  Maximum sterile technique was used including antiseptics, cap, gloves, gown, mask and sheet. Skin prep: Chlorhexidine; local anesthetic administered 20 gauge catheter was inserted into right radial artery using the Seldinger technique. ULTRASOUND GUIDANCE USED: NO Evaluation Blood flow good; BP tracing good. Complications: No apparent complications.   Morley Kos 07/09/2018

## 2018-07-09 NOTE — Progress Notes (Signed)
EEG preliminary interpretation: Background of fast beta activity. Rhythmic frontal slow wave activity develops approximately 15 minutes into the record and recurs intermittently. The findings are concerning for possible electrographic seizures versus long runs of FIRDA - the latter is considered to be less likely due to the evolving contour of the discharges.   A/R: 24 year old male with possible status epilepticus -- Loading with valproic acid 2000 mg x 1 -- Will start scheduled valproic acid 5 mg/kg IV TID -- Continue Keppra -- Switching to LTM EEG. -- If continued findings suspicious for electrographic seizure are seen, will start burst suppression protocol with Versed gtt  Additional 10 minutes of critical care time spent in EEG review.   Electronically signed: Dr. Caryl Pina

## 2018-07-09 NOTE — Procedures (Signed)
Procedure Note EMIGDIO DUTY [id number] 1996/02/03  Procedure: left side chest tube Indications: expanding pneumothorax in the setting of PPV  Procedure Details Consent: Unable to obtain consent because of emergent medical necessity. Time Out: Verified patient identification, verified procedure, site/side was marked, verified correct patient position, special equipment/implants available, medications/allergies/relevent history reviewed, required imaging and test results available.  Performed  Drugs:  100 mcg Fentanyl,  ~7th intercostal space incised after chloraprep skin prep and sterile drape. Blunt entry with escape of subcutaneous emphysema at deeper tissue layers, then on entry with kelly forceps into pleural space. 28 Fr chest tube entered easily. Hooked up to pleura-vac. 0-silk U sutures placed to fix tube in place, vaseline gauze and sterile dressing  Evaluation Hemodynamic Status: stable throughout; O2 sats: transiently improved, then started to decline.R PNA noted on f/u x-ray and decision made to place right CT. See Dr. Shirlee More procedure note Patient's Current Condition: critical Complications: No apparent complications Patient did tolerate procedure well. Chest X-ray ordered to verify placement.  CXR: tube position acceptable.  Gwynne Edinger, MD, PhD 07/09/18 3:26 PM

## 2018-07-09 NOTE — Procedures (Signed)
Chris A. Chris Laughter, MD     [email or web address]           HISTORY: This is a 23 year old who presents with new onset seizures.  The events occurred after the patient took opioids for severe headaches.  MEDICATIONS:  Current Facility-Administered Medications:  .  0.9 %  sodium chloride infusion, 250 mL, Intravenous, Continuous, Carter, Chris S, NP .  Place/Maintain arterial line, , , Until Discontinued **AND** 0.9 %  sodium chloride infusion, , Intra-arterial, PRN, Carter, Praveen, MD .  acetaminophen (TYLENOL) tablet 650 mg, 650 mg, Oral, Q4H PRN, Carter, Chris S, NP .  acyclovir (ZOVIRAX) 635 mg in dextrose 5 % 100 mL IVPB, 10 mg/kg, Intravenous, Q8H, Chris Carter, Carter, Last Rate: 112.7 mL/hr at 07/09/18 2002, 635 mg at 07/09/18 2002 .  albuterol (PROVENTIL) (2.5 MG/3ML) 0.083% nebulizer solution 2.5 mg, 2.5 mg, Nebulization, Q2H PRN, Carter, Chris S, NP .  artificial tears (LACRILUBE) ophthalmic ointment 1 application, 1 application, Both Eyes, Q8H, Carter, Chris S, NP .  cefTRIAXone (ROCEPHIN) 2 g in sodium chloride 0.9 % 100 mL IVPB, 2 g, Intravenous, Q12H, Chris Carter, Carter .  chlorhexidine gluconate (MEDLINE KIT) (PERIDEX) 0.12 % solution 15 mL, 15 mL, Mouth Rinse, BID, Carter, Praveen, MD .  cisatracurium (NIMBEX) 200 mg in sodium chloride 0.9 % 200 mL (1 mg/mL) infusion, 0.5-10 mcg/kg/min, Intravenous, Titrated, Carter, Praveen, MD, Last Rate: 1.91 mL/hr at 07/09/18 1900, 0.5 mcg/kg/min at 07/09/18 1900 .  cisatracurium (NIMBEX) injection 6.4 mg, 0.1 mg/kg, Intravenous, Q30 min PRN, Carter, Chris S, NP .  fentaNYL (SUBLIMAZE) 100 MCG/2ML injection, , , ,  .  fentaNYL (SUBLIMAZE) 100 MCG/2ML injection, , , ,  .  fentaNYL (SUBLIMAZE) bolus via infusion 50 mcg, 50 mcg, Intravenous, Q15 min PRN, Carter, Chris S, NP .  fentaNYL (SUBLIMAZE) injection 100 mcg, 100 mcg, Intravenous, Q2H PRN, Carter, Ankit, MD .  fentaNYL (SUBLIMAZE) injection 50  mcg, 50 mcg, Intravenous, Once, Carter, Chris S, NP .  fentaNYL (SUBLIMAZE) injection 50 mcg, 50 mcg, Intravenous, Once, Carter, Praveen, MD .  fentaNYL 2577mg in NS 2578m(1033mml) infusion-PREMIX, 50-300 mcg/hr, Intravenous, Continuous, Carter, Chris S, NP, Last Rate: 30 mL/hr at 07/09/18 1900, 300 mcg/hr at 07/09/18 1900 .  heparin injection 5,000 Units, 5,000 Units, Subcutaneous, Q8H, Carter, Chris S, NP .  insulin aspart (novoLOG) injection 2-6 Units, 2-6 Units, Subcutaneous, Q4H, Carter, Chris S, NP, 2 Units at 07/09/18 1959 .  levETIRAcetam (KEPPRA) IVPB 1000 mg/100 mL premix, 1,000 mg, Intravenous, Q12H, Chris Carter .  MEDLINE mouth rinse, 15 mL, Mouth Rinse, 10 times per day, Carter, Praveen, MD .  midazolam (VERSED) 2 MG/2ML injection, , , ,  .  midazolam (VERSED) 2 MG/2ML injection, , , ,  .  midazolam (VERSED) injection 2 mg, 2 mg, Intravenous, Q15 min PRN, Chris Humblenkit, MD, 2 mg at 07/09/18 1508 .  midazolam (VERSED) injection 2 mg, 2 mg, Intravenous, Q2H PRN, Carter, Ankit, MD .  norepinephrine (LEVOPHED) '4mg'$  in 250m53memix infusion, 5-50 mcg/min, Intravenous, Titrated, Carter, Chris S, NP, Last Rate: 86.3 mL/hr at 07/09/18 1942, 23 mcg/min at 07/09/18 1942 .  ondansetron (ZOFRAN) injection 4 mg, 4 mg, Intravenous, Q6H PRN, Carter, Chris S, NP .  pantoprazole (PROTONIX) injection 40 mg, 40 mg, Intravenous, QHS, Carter, Chris S, NP .  propofol (DIPRIVAN) 1000 MG/100ML infusion, 25-80 mcg/kg/min, Intravenous, Continuous, Carter, Chris S, NP, Last Rate: 22.9 mL/hr at 07/09/18 2032, 60 mcg/kg/min at 07/09/18 2032 .  valproate (  DEPACON) 350 mg in dextrose 5 % 50 mL IVPB, 350 mg, Intravenous, Q8H, Kerney Elbe, MD .  Derrill Memo ON 07/10/2018] vancomycin (VANCOCIN) IVPB 750 mg/150 ml premix, 750 mg, Intravenous, Q12H, Carter, Chris Carter, Carter     ANALYSIS: A 16 channel recording using standard 10 20 measurements is conducted for 23 minutes.  There is a well-formed  posterior dominant rhythm of 10.5 Hz which attenuates with eye opening.  There is some beta activity observed in frontal areas.  There is limited myogenic interference.  Photic stimulation and hyperventilation are not conducted.  Some drowsy architecture is seen briefly with vertex sharp waves noted. There is no focal or lateral slowing.  There is no epileptiform activity is observed.   IMPRESSION: 1.  This is a normal recording of awake and drowsy states.      Chris Carter, M.D.  Diplomate, Tax adviser of Psychiatry and Neurology ( Neurology).

## 2018-07-09 NOTE — Progress Notes (Signed)
RT note: RT and RN transported ventilator patient to 4N20. Vital signs stable through out. Report given to 4N RT.

## 2018-07-09 NOTE — Progress Notes (Signed)
Per 4N RN pt has been taken to CTA of the chest and then he will be coming to 4N20.

## 2018-07-09 NOTE — Progress Notes (Signed)
Critical ABG results given to MD. °

## 2018-07-10 ENCOUNTER — Inpatient Hospital Stay (HOSPITAL_COMMUNITY): Payer: Medicaid Other

## 2018-07-10 DIAGNOSIS — Z4682 Encounter for fitting and adjustment of non-vascular catheter: Secondary | ICD-10-CM

## 2018-07-10 LAB — MAGNESIUM: Magnesium: 2 mg/dL (ref 1.7–2.4)

## 2018-07-10 LAB — GLUCOSE, CAPILLARY
Glucose-Capillary: 118 mg/dL — ABNORMAL HIGH (ref 70–99)
Glucose-Capillary: 96 mg/dL (ref 70–99)
Glucose-Capillary: 76 mg/dL (ref 70–99)
Glucose-Capillary: 83 mg/dL (ref 70–99)
Glucose-Capillary: 93 mg/dL (ref 70–99)
Glucose-Capillary: 89 mg/dL (ref 70–99)

## 2018-07-10 LAB — CBC
HCT: 42.7 % (ref 39.0–52.0)
Hemoglobin: 14.7 g/dL (ref 13.0–17.0)
MCH: 30.6 pg (ref 26.0–34.0)
MCHC: 34.4 g/dL (ref 30.0–36.0)
MCV: 88.8 fL (ref 80.0–100.0)
Platelets: 235 10*3/uL (ref 150–400)
RBC: 4.81 MIL/uL (ref 4.22–5.81)
RDW: 13 % (ref 11.5–15.5)
WBC: 15.2 10*3/uL — ABNORMAL HIGH (ref 4.0–10.5)
nRBC: 0 % (ref 0.0–0.2)

## 2018-07-10 LAB — HIV ANTIBODY (ROUTINE TESTING W REFLEX): HIV Screen 4th Generation wRfx: NONREACTIVE

## 2018-07-10 LAB — BLOOD GAS, ARTERIAL
Acid-base deficit: 1.2 mmol/L (ref 0.0–2.0)
Bicarbonate: 24.2 mmol/L (ref 20.0–28.0)
Drawn by: 33100
FIO2: 100
MECHVT: 580 mL
O2 Saturation: 97.8 %
PEEP: 15 cmH2O
Patient temperature: 98.6
RATE: 20 resp/min
pCO2 arterial: 49.1 mmHg — ABNORMAL HIGH (ref 32.0–48.0)
pH, Arterial: 7.314 — ABNORMAL LOW (ref 7.350–7.450)
pO2, Arterial: 125 mmHg — ABNORMAL HIGH (ref 83.0–108.0)

## 2018-07-10 LAB — BASIC METABOLIC PANEL
Anion gap: 13 (ref 5–15)
BUN: 13 mg/dL (ref 6–20)
CO2: 23 mmol/L (ref 22–32)
Calcium: 7.9 mg/dL — ABNORMAL LOW (ref 8.9–10.3)
Chloride: 105 mmol/L (ref 98–111)
Creatinine, Ser: 1.42 mg/dL — ABNORMAL HIGH (ref 0.61–1.24)
GFR calc Af Amer: >60 mL/min (ref >60)
GFR calc non Af Amer: >60 mL/min (ref >60)
Glucose, Bld: 113 mg/dL — ABNORMAL HIGH (ref 70–99)
Potassium: 4.5 mmol/L (ref 3.5–5.1)
Sodium: 141 mmol/L (ref 135–145)

## 2018-07-10 LAB — TRIGLYCERIDES: Triglycerides: 38 mg/dL (ref <150)

## 2018-07-10 LAB — STREP PNEUMONIAE URINARY ANTIGEN: Strep Pneumo Urinary Antigen: NEGATIVE

## 2018-07-10 LAB — PHOSPHORUS: Phosphorus: 4.5 mg/dL (ref 2.5–4.6)

## 2018-07-10 LAB — PROCALCITONIN: Procalcitonin: 14.63 ng/mL

## 2018-07-10 MED ORDER — IPRATROPIUM-ALBUTEROL 0.5-2.5 (3) MG/3ML IN SOLN
3.0000 mL | Freq: Four times a day (QID) | RESPIRATORY_TRACT | Status: DC
  Administered 2018-07-10 – 2018-07-12 (×10): 3 mL via RESPIRATORY_TRACT
  Filled 2018-07-10 (×10): qty 3

## 2018-07-10 MED ORDER — CHLORHEXIDINE GLUCONATE CLOTH 2 % EX PADS
6.0000 | MEDICATED_PAD | Freq: Every day | CUTANEOUS | Status: DC
  Administered 2018-07-10 – 2018-07-11 (×2): 6 via TOPICAL
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
## 2018-07-10 NOTE — Progress Notes (Signed)
Subjective: Intubated and paralyzed with Nimbex.  Objective: Current vital signs: BP (!) 102/58 (BP Location: Left Arm)   Pulse 80   Temp 98.4 F (36.9 C) (Bladder)   Resp 20   Ht '5\' 10"'$  (1.778 m)   Wt 63.5 kg   SpO2 96%   BMI 20.09 kg/m  Vital signs in last 24 hours: Temp:  [98 F (36.7 C)-100.2 F (37.9 C)] 98.4 F (36.9 C) (05/31 1200) Pulse Rate:  [48-130] 80 (05/31 1200) Resp:  [20-43] 20 (05/31 1131) BP: (86-151)/(44-99) 102/58 (05/31 1200) SpO2:  [56 %-100 %] 96 % (05/31 1200) Arterial Line BP: (99-170)/(37-91) 126/57 (05/31 1200) FiO2 (%):  [70 %-100 %] 70 % (05/31 1200)  Intake/Output from previous day: 05/30 0701 - 05/31 0700 In: 4866 [I.V.:1785.1; IV Piggyback:3080.9] Out: 4048 [Urine:3705; Emesis/NG output:270; Chest Tube:73] Intake/Output this shift: Total I/O In: 624.7 [I.V.:393.4; IV Piggyback:231.3] Out: -  Nutritional status:  Diet Order            Diet NPO time specified  Diet effective now             HEENT: [place]/AT  Neurologic Exam: Neurological exam is uninformative due to paralytic. Patient is in bed, intubated with eyes closed. No movement noted.   Lab Results: Results for orders placed or performed during the hospital encounter of 07/09/18 (from the past 48 hour(s))  SARS Coronavirus 2 (CEPHEID - Performed in [place] hospital lab), Hosp Order     Status: None   Collection Time: 07/09/18  9:29 AM  Result Value Ref Range   SARS Coronavirus 2 NEGATIVE NEGATIVE    Comment: (NOTE) If result is NEGATIVE SARS-CoV-2 target nucleic acids are NOT DETECTED. The SARS-CoV-2 RNA is generally detectable in upper and lower  respiratory specimens during the acute phase of infection. The lowest  concentration of SARS-CoV-2 viral copies this assay can detect is 250  copies / mL. A negative result does not preclude SARS-CoV-2 infection  and should not be used as the sole basis for treatment or other  patient management decisions.  A negative result  may occur with  improper specimen collection / handling, submission of specimen other  than nasopharyngeal swab, presence of viral mutation(s) within the  areas targeted by this assay, and inadequate number of viral copies  (<250 copies / mL). A negative result must be combined with clinical  observations, patient history, and epidemiological information. If result is POSITIVE SARS-CoV-2 target nucleic acids are DETECTED. The SARS-CoV-2 RNA is generally detectable in upper and lower  respiratory specimens dur ing the acute phase of infection.  Positive  results are indicative of active infection with SARS-CoV-2.  Clinical  correlation with patient history and other diagnostic information is  necessary to determine patient infection status.  Positive results do  not rule out bacterial infection or co-infection with other viruses. If result is PRESUMPTIVE POSTIVE SARS-CoV-2 nucleic acids MAY BE PRESENT.   A presumptive positive result was obtained on the submitted specimen  and confirmed on repeat testing.  While 2019 novel coronavirus  (SARS-CoV-2) nucleic acids may be present in the submitted sample  additional confirmatory testing may be necessary for epidemiological  and / or clinical management purposes  to differentiate between  SARS-CoV-2 and other Sarbecovirus currently known to infect humans.  If clinically indicated additional testing with an alternate test  methodology [phone]) is advised. The SARS-CoV-2 RNA is generally  detectable in upper and lower respiratory sp ecimens during the acute  phase of  infection. The expected result is Negative. Fact Sheet for Patients:  StrictlyIdeas.no Fact Sheet for Healthcare Providers: BankingDealers.co.za This test is not yet approved or cleared by the [place] FDA and has been authorized for detection and/or diagnosis of SARS-CoV-2 by FDA under an Emergency Use Authorization (EUA).   This EUA will remain in effect (meaning this test can be used) for the duration of the COVID-19 declaration under Section 564(b)(1) of the Act, 21 U.S.C. section 360bbb-3(b)(1), unless the authorization is terminated or revoked sooner. Performed at [facility], [place] [address], [place], [place] [address]   CBG monitoring, ED     Status: Abnormal   Collection Time: 07/09/18  9:34 AM  Result Value Ref Range   Glucose-Capillary 202 (H) 70 - 99 mg/dL   Comment 1 Notify RN    Comment 2 Document in Chart   Urinalysis, Routine w reflex microscopic     Status: Abnormal   Collection Time: 07/09/18  9:43 AM  Result Value Ref Range   Color, Urine STRAW (A) YELLOW   APPearance CLEAR CLEAR   Specific Gravity, Urine 1.009 1.005 - 1.030   pH 5.0 5.0 - 8.0   Glucose, UA >=500 (A) NEGATIVE mg/dL   Hgb urine dipstick SMALL (A) NEGATIVE   Bilirubin Urine NEGATIVE NEGATIVE   Ketones, ur NEGATIVE NEGATIVE mg/dL   Protein, ur NEGATIVE NEGATIVE mg/dL   Nitrite NEGATIVE NEGATIVE   Leukocytes,Ua NEGATIVE NEGATIVE   RBC / HPF 0-5 0 - 5 RBC/hpf   WBC, UA 0-5 0 - 5 WBC/hpf   Bacteria, UA RARE (A) NONE SEEN   Squamous Epithelial / LPF 0-5 0 - 5   Mucus PRESENT    Hyaline Casts, UA PRESENT     Comment: Performed at [facility], [address], [place], [place] [address]  Rapid urine drug screen (hospital performed)     Status: Abnormal   Collection Time: 07/09/18  9:44 AM  Result Value Ref Range   Opiates POSITIVE (A) NONE DETECTED   Cocaine NONE DETECTED NONE DETECTED   Benzodiazepines POSITIVE (A) NONE DETECTED   Amphetamines NONE DETECTED NONE DETECTED   Tetrahydrocannabinol POSITIVE (A) NONE DETECTED   Barbiturates NONE DETECTED NONE DETECTED    Comment: (NOTE) DRUG SCREEN FOR MEDICAL PURPOSES ONLY.  IF CONFIRMATION IS NEEDED FOR ANY PURPOSE, NOTIFY LAB WITHIN 5 DAYS. LOWEST DETECTABLE LIMITS FOR URINE DRUG SCREEN Drug Class                     Cutoff (ng/mL) Amphetamine and  metabolites    1000 Barbiturate and metabolites    200 Benzodiazepine                 709 Tricyclics and metabolites     300 Opiates and metabolites        300 Cocaine and metabolites        300 THC                            50 Performed at [facility], [place] [address] [place], [place] [address]   Ethanol     Status: None   Collection Time: 07/09/18  9:54 AM  Result Value Ref Range   Alcohol, Ethyl (B) <10 <10 mg/dL    Comment: (NOTE) Lowest detectable limit for serum alcohol is 10 mg/dL. For medical purposes only. Performed at [facility], [place] [address],  [place], [place] [address]   Comprehensive metabolic panel     Status: Abnormal   Collection Time: 07/09/18  9:55 AM  Result Value Ref Range   Sodium 136 135 - 145 mmol/L   Potassium 3.9 3.5 - 5.1 mmol/L   Chloride 101 98 - 111 mmol/L   CO2 15 (L) 22 - 32 mmol/L   Glucose, Bld 226 (H) 70 - 99 mg/dL   BUN 18 6 - 20 mg/dL   Creatinine, Ser 1.37 (H) 0.61 - 1.24 mg/dL   Calcium 8.8 (L) 8.9 - 10.3 mg/dL   Total Protein 6.6 6.5 - 8.1 g/dL   Albumin 4.1 3.5 - 5.0 g/dL   AST 31 15 - 41 U/L   ALT 23 0 - 44 U/L   Alkaline Phosphatase 60 38 - 126 U/L   Total Bilirubin 0.6 0.3 - 1.2 mg/dL   GFR calc non Af Amer >60 >60 mL/min   GFR calc Af Amer >60 >60 mL/min   Anion gap 20 (H) 5 - 15    Comment: Performed at [facility] [address] [place], [place] [address]  CBC     Status: None   Collection Time: 07/09/18  9:55 AM  Result Value Ref Range   WBC 10.3 4.0 - 10.5 K/uL   RBC 4.91 4.22 - 5.81 MIL/uL   Hemoglobin 14.8 13.0 - 17.0 g/dL   HCT 46.9 39.0 - 52.0 %   MCV 95.5 80.0 - 100.0 fL   MCH 30.1 26.0 - 34.0 pg   MCHC 31.6 30.0 - 36.0 g/dL   RDW 13.0 11.5 - 15.5 %   Platelets 279 150 - 400 K/uL   nRBC 0.0 0.0 - 0.2 %    Comment: Performed at [facility], [place] [address], [place], [place] [address]  Triglycerides     Status: None   Collection Time: 07/09/18  9:55 AM  Result Value Ref Range    Triglycerides 45 <150 mg/dL    Comment: Performed at [facility] [address] [place], [place] [address]  I-STAT 7, (LYTES, BLD GAS, ICA, H+H)     Status: Abnormal   Collection Time: 07/09/18 10:03 AM  Result Value Ref Range   pH, Arterial 7.220 (L) 7.350 - 7.450   pCO2 arterial 50.2 (H) 32.0 - 48.0 mmHg   pO2, Arterial 146.0 (H) 83.0 - 108.0 mmHg   Bicarbonate 20.7 20.0 - 28.0 mmol/L   TCO2 22 22 - 32 mmol/L   O2 Saturation 99.0 %   Acid-base deficit 8.0 (H) 0.0 - 2.0 mmol/L   Sodium 137 135 - 145 mmol/L   Potassium 3.4 (L) 3.5 - 5.1 mmol/L   Calcium, Ion 1.18 1.15 - 1.40 mmol/L   HCT 42.0 39.0 - 52.0 %   Hemoglobin 14.3 13.0 - 17.0 g/dL   Patient temperature 97.6 F    Collection site RADIAL, ALLEN'S TEST ACCEPTABLE    Drawn by Operator    Sample type ARTERIAL   Lactic acid, plasma     Status: Abnormal   Collection Time: 07/09/18 10:50 AM  Result Value Ref Range   Lactic Acid, Venous 10.5 (HH) 0.5 - 1.9 mmol/L    Comment: CRITICAL RESULT CALLED TO, READ BACK BY AND VERIFIED WITH: K.BROWN,RN @ 1128 07/09/2018 [place] Performed at [facility], [address], [place], [place] [address]   I-STAT 7, (LYTES, BLD GAS, ICA, H+H)     Status: Abnormal   Collection Time: 07/09/18 11:32 AM  Result Value Ref Range   pH, Arterial 7.245 (L) 7.350 - 7.450   pCO2 arterial 48.3 (H) 32.0 - 48.0 mmHg   pO2, Arterial 46.0 (L) 83.0 - 108.0 mmHg   Bicarbonate 21.3 20.0 - 28.0 mmol/L   TCO2 23 22 - 32 mmol/L   O2 Saturation 78.0 %   Acid-base deficit 7.0 (H) 0.0 - 2.0 mmol/L   Sodium 139 135 - 145 mmol/L   Potassium 3.3 (L) 3.5 - 5.1 mmol/L   Calcium, Ion 1.17 1.15 - 1.40 mmol/L   HCT 37.0 (L) 39.0 - 52.0 %   Hemoglobin 12.6 (L) 13.0 - 17.0 g/dL   Patient temperature 96.1 F    Collection site RADIAL, ALLEN'S TEST ACCEPTABLE    Drawn by Operator    Sample type ARTERIAL   Magnesium     Status: None   Collection Time: 07/09/18  2:24 PM  Result Value Ref Range   Magnesium  2.1 1.7 - 2.4 mg/dL    Comment: Performed at [facility], [place] [address], [place], [place] [address]  Phosphorus     Status: None   Collection Time: 07/09/18  2:24 PM  Result Value Ref Range   Phosphorus 3.3 2.5 - 4.6 mg/dL    Comment: Performed at [facility] [address] [place], [place] [address]  Lactic acid, plasma     Status: None   Collection Time: 07/09/18  2:24 PM  Result Value Ref Range   Lactic Acid, Venous 1.7 0.5 - 1.9 mmol/L    Comment: Performed at [facility] [address] [place], [place] [address]  Procalcitonin     Status: None   Collection Time: 07/09/18  2:24 PM  Result Value Ref Range   Procalcitonin 0.38 ng/mL    Comment:        Interpretation: PCT (Procalcitonin) <= 0.5 ng/mL: Systemic infection (sepsis) is not likely. Local bacterial infection is possible. (NOTE)       Sepsis PCT Algorithm           Lower Respiratory Tract                                      Infection PCT Algorithm    ----------------------------     ----------------------------         PCT < 0.25 ng/mL                PCT < 0.10 ng/mL         Strongly encourage             Strongly discourage   discontinuation of antibiotics    initiation of antibiotics    ----------------------------     -----------------------------       PCT 0.25 - 0.50 ng/mL            PCT 0.10 - 0.25 ng/mL               OR       >80% decrease in PCT            Discourage initiation of                                            antibiotics      Encourage discontinuation  of antibiotics    ----------------------------     -----------------------------         PCT >= 0.50 ng/mL              PCT 0.26 - 0.50 ng/mL               AND        <80% decrease in PCT             Encourage initiation of                                             antibiotics       Encourage continuation           of antibiotics    ----------------------------     -----------------------------        PCT  >= 0.50 ng/mL                  PCT > 0.50 ng/mL               AND         increase in PCT                  Strongly encourage                                      initiation of antibiotics    Strongly encourage escalation           of antibiotics                                     -----------------------------                                           PCT <= 0.25 ng/mL                                                 OR                                        > 80% decrease in PCT                                     Discontinue / Do not initiate                                             antibiotics Performed at [facility], [address], [place], [place] [address]   Brain natriuretic peptide     Status: None   Collection Time: 07/09/18  2:24 PM  Result Value Ref Range   B Natriuretic Peptide 31.4 0.0 - 100.0  pg/mL    Comment: Performed at [facility], [place] [address], [place], [place] [address]  Cortisol     Status: None   Collection Time: 07/09/18  2:24 PM  Result Value Ref Range   Cortisol, Plasma 14.3 ug/dL    Comment: (NOTE) AM    6.7 - 22.6 ug/dL PM   <10.0       ug/dL Performed at [facility] [address] [place], [place] [address]   Troponin I - Once     Status: Abnormal   Collection Time: 07/09/18  2:24 PM  Result Value Ref Range   Troponin I 0.03 (HH) <0.03 ng/mL    Comment: CRITICAL RESULT CALLED TO, READ BACK BY AND VERIFIED WITH: Sherlene Shams, T RN @ 1526 ON 07/09/2018 BY TEMOCHE, H Performed at [facility], [address], [place], [place] [address]   I-STAT 7, (LYTES, BLD GAS, ICA, H+H)     Status: Abnormal   Collection Time: 07/09/18  2:26 PM  Result Value Ref Range   pH, Arterial 7.276 (L) 7.350 - 7.450   pCO2 arterial 50.0 (H) 32.0 - 48.0 mmHg   pO2, Arterial 35.0 (LL) 83.0 - 108.0 mmHg   Bicarbonate 23.1 20.0 - 28.0 mmol/L   TCO2 25 22 - 32 mmol/L   O2 Saturation 56.0 %   Acid-base deficit 4.0 (H) 0.0 - 2.0 mmol/L    Sodium 136 135 - 145 mmol/L   Potassium 3.2 (L) 3.5 - 5.1 mmol/L   Calcium, Ion 1.13 (L) 1.15 - 1.40 mmol/L   HCT 47.0 39.0 - 52.0 %   Hemoglobin 16.0 13.0 - 17.0 g/dL   Patient temperature 99.8 F    Collection site ARTERIAL LINE    Drawn by RT    Sample type ARTERIAL    Comment NOTIFIED PHYSICIAN   I-STAT 7, (LYTES, BLD GAS, ICA, H+H)     Status: Abnormal   Collection Time: 07/09/18  3:49 PM  Result Value Ref Range   pH, Arterial 7.354 7.350 - 7.450   pCO2 arterial 41.1 32.0 - 48.0 mmHg   pO2, Arterial 49.0 (L) 83.0 - 108.0 mmHg   Bicarbonate 23.0 20.0 - 28.0 mmol/L   TCO2 24 22 - 32 mmol/L   O2 Saturation 84.0 %   Acid-base deficit 3.0 (H) 0.0 - 2.0 mmol/L   Sodium 136 135 - 145 mmol/L   Potassium 3.2 (L) 3.5 - 5.1 mmol/L   Calcium, Ion 1.11 (L) 1.15 - 1.40 mmol/L   HCT 45.0 39.0 - 52.0 %   Hemoglobin 15.3 13.0 - 17.0 g/dL   Patient temperature 97.8 F    Collection site ARTERIAL LINE    Drawn by RT    Sample type ARTERIAL   Glucose, capillary     Status: Abnormal   Collection Time: 07/09/18  3:50 PM  Result Value Ref Range   Glucose-Capillary 134 (H) 70 - 99 mg/dL   Comment 1 Notify RN    Comment 2 Document in Chart   I-STAT 7, (LYTES, BLD GAS, ICA, H+H)     Status: Abnormal   Collection Time: 07/09/18  4:44 PM  Result Value Ref Range   pH, Arterial 7.343 (L) 7.350 - 7.450   pCO2 arterial 42.0 32.0 - 48.0 mmHg   pO2, Arterial 70.0 (L) 83.0 - 108.0 mmHg   Bicarbonate 22.9 20.0 - 28.0 mmol/L   TCO2 24 22 - 32 mmol/L   O2 Saturation 93.0 %   Acid-base deficit 3.0 (  H) 0.0 - 2.0 mmol/L   Sodium 136 135 - 145 mmol/L   Potassium 3.1 (L) 3.5 - 5.1 mmol/L   Calcium, Ion 1.11 (L) 1.15 - 1.40 mmol/L   HCT 43.0 39.0 - 52.0 %   Hemoglobin 14.6 13.0 - 17.0 g/dL   Patient temperature 98.6 F    Collection site ARTERIAL LINE    Drawn by RT    Sample type ARTERIAL   Lactic acid, plasma     Status: Abnormal   Collection Time: 07/09/18  5:00 PM  Result Value Ref Range   Lactic  Acid, Venous 3.5 (HH) 0.5 - 1.9 mmol/L    Comment: CRITICAL RESULT CALLED TO, READ BACK BY AND VERIFIED WITH: Sherlene Shams, T RN @ 1900 ON 07/09/2018 BY TEMOCHE, H Performed at [facility], [address] [place], [place] [address]   Glucose, capillary     Status: Abnormal   Collection Time: 07/09/18  7:41 PM  Result Value Ref Range   Glucose-Capillary 122 (H) 70 - 99 mg/dL  Lactic acid, plasma     Status: Abnormal   Collection Time: 07/09/18  9:39 PM  Result Value Ref Range   Lactic Acid, Venous 3.6 (HH) 0.5 - 1.9 mmol/L    Comment: CRITICAL RESULT CALLED TO, READ BACK BY AND VERIFIED WITH: CARMICHAEL,J RN 07/09/2018 2219 JORDANS Performed at [facility], [place] [address] [place], [place] [address]   Glucose, capillary     Status: Abnormal   Collection Time: 07/09/18 11:33 PM  Result Value Ref Range   Glucose-Capillary 113 (H) 70 - 99 mg/dL  Glucose, capillary     Status: Abnormal   Collection Time: 07/10/18  3:13 AM  Result Value Ref Range   Glucose-Capillary 118 (H) 70 - 99 mg/dL  CBC     Status: Abnormal   Collection Time: 07/10/18  4:04 AM  Result Value Ref Range   WBC 15.2 (H) 4.0 - 10.5 K/uL   RBC 4.81 4.22 - 5.81 MIL/uL   Hemoglobin 14.7 13.0 - 17.0 g/dL   HCT 42.7 39.0 - 52.0 %   MCV 88.8 80.0 - 100.0 fL    Comment: REPEATED TO VERIFY DELTA CHECK NOTED PREVIOUS ELEVATED GLUCOSE    MCH 30.6 26.0 - 34.0 pg   MCHC 34.4 30.0 - 36.0 g/dL   RDW 13.0 11.5 - 15.5 %   Platelets 235 150 - 400 K/uL   nRBC 0.0 0.0 - 0.2 %    Comment: Performed at [facility], [place] [address], [place], [place] [address]  Basic metabolic panel     Status: Abnormal   Collection Time: 07/10/18  4:04 AM  Result Value Ref Range   Sodium 141 135 - 145 mmol/L   Potassium 4.5 3.5 - 5.1 mmol/L    Comment: DELTA CHECK NOTED   Chloride 105 98 - 111 mmol/L   CO2 23 22 - 32 mmol/L   Glucose, Bld 113 (H) 70 - 99 mg/dL   BUN 13 6 - 20 mg/dL   Creatinine, Ser 1.42 (H) 0.61 - 1.24  mg/dL   Calcium 7.9 (L) 8.9 - 10.3 mg/dL   GFR calc non Af Amer >60 >60 mL/min   GFR calc Af Amer >60 >60 mL/min   Anion gap 13 5 - 15    Comment: Performed at [facility], [place] [address], [place], [place] [address]  Magnesium     Status: None   Collection Time: 07/10/18  4:04 AM  Result Value Ref Range   Magnesium 2.0 1.7 - 2.4 mg/dL    Comment: Performed at [facility], [place] [address], [place], [place] [address]  Phosphorus     Status: None   Collection Time: 07/10/18  4:04 AM  Result Value Ref Range   Phosphorus 4.5 2.5 - 4.6 mg/dL    Comment: Performed at [facility] [address] [place], [place] [address]  Procalcitonin     Status: None   Collection Time: 07/10/18  4:04 AM  Result Value Ref Range   Procalcitonin 14.63 ng/mL    Comment:        Interpretation: PCT >= 10 ng/mL: Important systemic inflammatory response, almost exclusively due to severe bacterial sepsis or septic shock. (NOTE)       Sepsis PCT Algorithm           Lower Respiratory Tract                                      Infection PCT Algorithm    ----------------------------     ----------------------------         PCT < 0.25 ng/mL                PCT < 0.10 ng/mL         Strongly encourage             Strongly discourage   discontinuation of antibiotics    initiation of antibiotics    ----------------------------     -----------------------------       PCT 0.25 - 0.50 ng/mL            PCT 0.10 - 0.25 ng/mL               OR       >80% decrease in PCT            Discourage initiation of                                            antibiotics      Encourage discontinuation           of antibiotics    ----------------------------     -----------------------------         PCT >= 0.50 ng/mL              PCT 0.26 - 0.50 ng/mL                AND       <80% decrease in PCT             Encourage initiation of                                             antibiotics       Encourage  continuation           of antibiotics    ----------------------------     -----------------------------        PCT >= 0.50 ng/mL                  PCT > 0.50 ng/mL  AND         increase in PCT                  Strongly encourage                                      initiation of antibiotics    Strongly encourage escalation           of antibiotics                                     -----------------------------                                           PCT <= 0.25 ng/mL                                                 OR                                        > 80% decrease in PCT                                     Discontinue / Do not initiate                                             antibiotics Performed at [facility], [address], [place], [place] [address]   Triglycerides     Status: None   Collection Time: 07/10/18  4:04 AM  Result Value Ref Range   Triglycerides 38 <150 mg/dL    Comment: Performed at [facility] [address], [place], [place] [address]  Strep pneumoniae urinary antigen  (not at [facility])     Status: None   Collection Time: 07/10/18  4:08 AM  Result Value Ref Range   Strep Pneumo Urinary Antigen NEGATIVE NEGATIVE    Comment:        Infection due to S. pneumoniae cannot be absolutely ruled out since the antigen present may be below the detection limit of the test. Performed at [facility], [place] [address], [place], [place] [address]   Blood gas, arterial     Status: Abnormal   Collection Time: 07/10/18  4:20 AM  Result Value Ref Range   FIO2 100.00    Delivery systems VENTILATOR    Mode PRESSURE REGULATED VOLUME CONTROL    VT 580 mL   LHR 20 resp/min   Peep/cpap 15.0 cm H20   pH, Arterial 7.314 (L) 7.350 - 7.450   pCO2 arterial 49.1 (H) 32.0 - 48.0 mmHg   pO2, Arterial 125 (H) 83.0 - 108.0 mmHg   Bicarbonate 24.2 20.0 - 28.0 mmol/L   Acid-base deficit 1.2 0.0 - 2.0 mmol/L   O2  Saturation 97.8 %   Patient  temperature 98.6    Collection site A-LINE    Drawn by [id number]    Sample type ARTERIAL DRAW    Allens test (pass/fail) PASS PASS  Glucose, capillary     Status: None   Collection Time: 07/10/18  7:37 AM  Result Value Ref Range   Glucose-Capillary 96 70 - 99 mg/dL   Comment 1 Notify RN    Comment 2 Document in Chart   Glucose, capillary     Status: None   Collection Time: 07/10/18 11:48 AM  Result Value Ref Range   Glucose-Capillary 76 70 - 99 mg/dL   Comment 1 Notify RN    Comment 2 Document in Chart     Recent Results (from the past 240 hour(s))  SARS Coronavirus 2 (CEPHEID - Performed in [facility] lab), Hosp Order     Status: None   Collection Time: 07/09/18  9:29 AM  Result Value Ref Range Status   SARS Coronavirus 2 NEGATIVE NEGATIVE Final    Comment: (NOTE) If result is NEGATIVE SARS-CoV-2 target nucleic acids are NOT DETECTED. The SARS-CoV-2 RNA is generally detectable in upper and lower  respiratory specimens during the acute phase of infection. The lowest  concentration of SARS-CoV-2 viral copies this assay can detect is 250  copies / mL. A negative result does not preclude SARS-CoV-2 infection  and should not be used as the sole basis for treatment or other  patient management decisions.  A negative result may occur with  improper specimen collection / handling, submission of specimen other  than nasopharyngeal swab, presence of viral mutation(s) within the  areas targeted by this assay, and inadequate number of viral copies  (<250 copies / mL). A negative result must be combined with clinical  observations, patient history, and epidemiological information. If result is POSITIVE SARS-CoV-2 target nucleic acids are DETECTED. The SARS-CoV-2 RNA is generally detectable in upper and lower  respiratory specimens dur ing the acute phase of infection.  Positive  results are indicative of active infection with SARS-CoV-2.  Clinical  correlation with patient  history and other diagnostic information is  necessary to determine patient infection status.  Positive results do  not rule out bacterial infection or co-infection with other viruses. If result is PRESUMPTIVE POSTIVE SARS-CoV-2 nucleic acids MAY BE PRESENT.   A presumptive positive result was obtained on the submitted specimen  and confirmed on repeat testing.  While 2019 novel coronavirus  (SARS-CoV-2) nucleic acids may be present in the submitted sample  additional confirmatory testing may be necessary for epidemiological  and / or clinical management purposes  to differentiate between  SARS-CoV-2 and other Sarbecovirus currently known to infect humans.  If clinically indicated additional testing with an alternate test  methodology [phone]) is advised. The SARS-CoV-2 RNA is generally  detectable in upper and lower respiratory sp ecimens during the acute  phase of infection. The expected result is Negative. Fact Sheet for Patients:  StrictlyIdeas.no Fact Sheet for Healthcare Providers: BankingDealers.co.za This test is not yet approved or cleared by the [place] FDA and has been authorized for detection and/or diagnosis of SARS-CoV-2 by FDA under an Emergency Use Authorization (EUA).  This EUA will remain in effect (meaning this test can be used) for the duration of the COVID-19 declaration under Section 564(b)(1) of the Act, 21 U.S.C. section 360bbb-3(b)(1), unless the authorization is terminated or revoked sooner. Performed at [facility], [place] [address], [place], [place]  [address]     Lipid Panel Recent Labs    07/10/18 0404  TRIG 38    Studies/Results: Ct Head Wo Contrast  Result Date: 07/09/2018 CLINICAL DATA:  Seizure activity, new EXAM: CT HEAD WITHOUT CONTRAST TECHNIQUE: Contiguous axial images were obtained from the base of the skull through the vertex without intravenous contrast. COMPARISON:  None.  FINDINGS: Brain: No evidence of acute infarction, hemorrhage, hydrocephalus, extra-axial collection or mass lesion/mass effect. Vascular: No hyperdense vessel or unexpected calcification. Skull: Normal. Negative for fracture or focal lesion. Sinuses/Orbits: The visualized paranasal sinuses are essentially clear. The mastoid air cells are unopacified. Other: None. IMPRESSION: Normal head CT. Electronically Signed   By: Julian Hy M.D.   On: 07/09/2018 10:30   Ct Angio Chest Pe W Or Wo Contrast  Result Date: 07/09/2018 CLINICAL DATA:  Severe hypoxia patient today. Patient status post seizure. EXAM: CT ANGIOGRAPHY CHEST WITH CONTRAST TECHNIQUE: Multidetector CT imaging of the chest was performed using the standard protocol during bolus administration of intravenous contrast. Multiplanar CT image reconstructions and MIPs were obtained to evaluate the vascular anatomy. CONTRAST:  60 mL OMNIPAQUE IOHEXOL 350 MG/ML SOLN COMPARISON:  Plain films of the chest earlier today. FINDINGS: Cardiovascular: Satisfactory opacification of the pulmonary arteries to the segmental level. No evidence of pulmonary embolism. Normal heart size. No pericardial effusion. Mediastinum/Nodes: There is extensive pneumomediastinum with gas tracking into the soft tissues of the neck and along the rectus abdominus. Endotracheal tube is in place with the tip in good position well above the carina. NG tube courses into the stomach and below the inferior margin of the scan. No lymphadenopathy. Lungs/Pleura: The patient has small bilateral pneumothoraces, larger on the left where it is estimated at approximately 40%. Dense opacity is present in the lower lobes bilaterally, worse on the left. Patchy airspace disease is also present in the left upper lobe. Upper Abdomen: Imaged abdominal viscera is unremarkable. NG tube tip is in the stomach. Musculoskeletal: No acute or focal bony abnormality is seen. Review of the MIP images confirms the above  findings. IMPRESSION: Bilateral pneumothoraces, larger on the left were pneumothorax it is estimated at 40%. Right pneumothorax is very small. Dense bilateral lower lobe opacities and patchy airspace disease in the left upper lobe. Airspace disease is likely due to aspiration given the patient's history. There is also a component of collapse in the lower lobes. Pneumonia is possible less. Extensive pneumomediastinum. Cause for this is not identified. Question barotrauma. Critical Value/emergent results were called by telephone at the time of interpretation on 07/09/2018 at 2:34 pm to [place], N.P., who verbally acknowledged these results. Electronically Signed   By: Inge Rise M.D.   On: 07/09/2018 14:39   Dg Chest Port 1 View  Result Date: 07/10/2018 CLINICAL DATA:  Increase left pneumothorax today in a patient with a left chest tube in place. EXAM: PORTABLE CHEST 1 VIEW COMPARISON:  Single-view of the chest at 7:52 a.m. today. FINDINGS: Support tubes and lines including bilateral chest tubes are unchanged. The left pneumothorax seen on the prior examination has markedly improved and is now estimated at 10%. No right pneumothorax. Airspace disease in the left mid chest is unchanged. IMPRESSION: Decreased left pneumothorax now estimated at 10%. No new abnormality or other change. Electronically Signed   By: Inge Rise M.D.   On: 07/10/2018 11:37   Dg Chest Port 1 View  Result Date: 07/10/2018 CLINICAL DATA:  Respiratory failure EXAM: PORTABLE CHEST 1 VIEW COMPARISON:  Chest  radiograph from one day prior. FINDINGS: Endotracheal tube tip is 4.2 cm above the carina. Enteric tube enters stomach with the tip not seen on this image. Left internal jugular central venous catheter terminates at the cavoatrial junction. Bibasilar chest tubes are in place. Stable cardiomediastinal silhouette with normal heart size. No appreciable right pneumothorax. Large left pneumothorax has significantly increased,  now approximately 30%. No pleural effusion. Patchy parahilar opacities in the left greater than right lungs are similar. Subcutaneous emphysema in the bilateral lower neck again noted. IMPRESSION: 1. Large left pneumothorax, approximately 30%, significantly increased. 2. No appreciable residual right pneumothorax. 3. Similar patchy parahilar lung opacities, left greater than right. Critical Value/emergent results were called by telephone at the time of interpretation on 07/10/2018 at 9:11 am to Dr. Rexene Edison , who verbally acknowledged these results. Electronically Signed   By: Ilona Sorrel M.D.   On: 07/10/2018 09:19   Dg Chest Port 1 View  Result Date: 07/10/2018 CLINICAL DATA:  Follow-up right pneumothorax. EXAM: PORTABLE CHEST 1 VIEW COMPARISON:  07/09/2018 FINDINGS: Endotracheal tube, left jugular central venous catheter, orogastric tube, and bilateral chest tubes remain in appropriate position. Small left basilar pneumothorax has decreased in size since previous study. Decreased airspace disease in left lower lung. Decreased size of pneumomediastinum also noted. A small right pneumothorax appears mildly increased in size since prior study. Decreased airspace opacity seen in the medial right lung base. No new or increased areas of pulmonary opacity are identified. Heart size is normal. IMPRESSION: 1. Mild increase in size of small right pneumothorax, with chest tube remaining in place. 2. Decreased left basilar pneumothorax and pneumomediastinum. 3. Decreased airspace opacity in both lower lung zones. Electronically Signed   By: Earle Gell M.D.   On: 07/10/2018 00:24   Dg Chest Port 1 View  Result Date: 07/09/2018 CLINICAL DATA:  Pneumothorax EXAM: PORTABLE CHEST 1 VIEW COMPARISON:  Radiograph 07/09/2018 at 1518 hours, CT 07/09/2018 FINDINGS: Persistent LEFT pneumothorax measuring 3.2 cm from the lateral chest wall unchanged . Chest tube positioned at the LEFT lung base directed downward. No change.  Dense consolidation in the LEFT lower lobe. Chest tube at the RIGHT lung base. No evidence of RIGHT pneumothorax. The LEFT hemithorax volume appears greater than the RIGHT and potential shift of the heart towards the RIGHT. Endotracheal tube and NG tube unchanged. Central venous line unchanged. Small pneumomediastinum again noted. Gas in the posterior triangles of the neck. IMPRESSION: 1. Persistent large LEFT pneumothorax with chest tube at the LEFT lung base. The LEFT hemithorax volume appears larger than the RIGHT which could indicate tension pneumothorax. 2. RIGHT chest tube in place without pneumothorax. 3. Persistent pneumomediastinum and subcutaneous gas in the neck. 4. Endotracheal tube in good position. Electronically Signed   By: Suzy Bouchard M.D.   On: 07/09/2018 18:40   Dg Chest Port 1 View  Result Date: 07/09/2018 CLINICAL DATA:  Right chest tube placement EXAM: PORTABLE CHEST 1 VIEW COMPARISON:  Chest x-ray Jul 09, 2018 FINDINGS: A left-sided chest tube remains in place, stable. The left-sided pneumothorax is larger in the interval at the base measuring up to 3.3 cm. Only trace pneumothorax was seen previously. The left-sided pneumothorax does extend into the apex measuring approximately 3.3 cm as well. Increasing density in the left lung is likely due to decreased aeration. A right sided chest tube has been placed in the interval. The right-sided pneumothorax is no longer seen. Mediastinal air remains, also seen on the CT scan from earlier  today. The ETT is stable. The NG tube side port is just below the GE junction. Stable left central line. Transcutaneous pacer leads overlie the lower left chest. No change in the cardiomediastinal silhouette. No other acute abnormalities. Continued air in the subcutaneous tissues at the base of the neck bilaterally. IMPRESSION: 1. The left sided pneumothorax has enlarged despite the left-sided chest tube which is stable. The left-sided pneumothorax now  measures up to 3.3 cm in thickness at the base. 2. Right-sided chest tube has been placed. No right-sided pneumothorax seen. 3. Continued mediastinal air. 4. Other support apparatus is in good position. 5. Increasing density in the left lung may be due to decreasing aeration. 6. No other acute abnormalities. Findings called to the patient's nurse, PK. Electronically Signed   By: Dorise Bullion III M.D   On: 07/09/2018 15:41   Dg Chest Port 1 View  Result Date: 07/09/2018 CLINICAL DATA:  Chest tube placement EXAM: PORTABLE CHEST 1 VIEW COMPARISON:  Chest x-ray from earlier today at 1243 FINDINGS: Left-sided chest tube has been placed and follows the diaphragm with tip present inferiorly. A left pneumothorax is trace at the apex. Right pneumothorax is enlarged since prior chest x-ray, with pleural line seen laterally at the base. There is likely also a sizable anterior component given asymmetric lucency. Quantification is difficult due to positioning, potentially 25% or larger. Bilateral lower lobe opacification with worse collapse on the right. Endotracheal tube tip between the clavicular heads and carina. Left IJ line with tip at the upper cavoatrial junction. The orogastric tube reaches the stomach. Pneumomediastinum. Contrast is still being excreted from the kidneys. These results were called by telephone at the time of interpretation on 07/09/2018 at 3:24 pm to [place] , who verbally acknowledged these results. IMPRESSION: 1. Left chest tube at the base.  Left pneumothorax is trace. 2. Enlarging, moderate right pneumothorax. 3. Lower lobe collapse/consolidation. Electronically Signed   By: Monte Fantasia M.D.   On: 07/09/2018 15:24   Dg Chest Portable 1 View  Result Date: 07/09/2018 CLINICAL DATA:  Status post central line placement today. EXAM: PORTABLE CHEST 1 VIEW COMPARISON:  Single-view of the chest 11:35 a.m. this morning. FINDINGS: New left IJ approach central venous catheter is in place with its  tip just above the superior cavoatrial junction. ETT, NG tube and defibrillator pads are also identified. Right perihilar airspace disease and airspace opacities in the left mid and lower lung zones persist. The patient has a small left pneumothorax, less than 5%. Extensive gas is present in the mediastinum and the soft tissues of the neck and has markedly increased since the study earlier today. Cause for this abnormality is not identified. IMPRESSION: Left IJ central venous catheter tip projects in the lower superior vena cava. Patient has a small left pneumothorax. Extensive pneumomediastinum has markedly worsened since the study approximately 2 hours ago. No change in left much worse than right airspace disease. Critical Value/emergent results were called by telephone at the time of interpretation on 07/09/2018 at 1:33 pm to Dr. Kathrynn Humble, who verbally acknowledged these results. Electronically Signed   By: Inge Rise M.D.   On: 07/09/2018 13:38   Dg Chest Portable 1 View  Result Date: 07/09/2018 CLINICAL DATA:  Respiratory difficulty EXAM: PORTABLE CHEST 1 VIEW COMPARISON:  Today 924 hours FINDINGS: NG tube tip is now beyond the gastroesophageal junction. Endotracheal tube is stable. There is now linear lucency along the right side of the mediastinum extending towards the right supraclavicular  region worrisome for pneumomediastinum. There is subcutaneous emphysema in the right side of the neck. No definite pneumothorax can be visualized. Airspace disease throughout the left lung is worse. Central right airspace disease has developed. IMPRESSION: There is now emphysema in the right neck as well as findings suggesting pneumomediastinum. No definite pneumothorax is identified. Endotracheal tube is stable. NG tube tip is now in the stomach Worsening airspace disease throughout the left lung. New central right lung airspace disease. Electronically Signed   By: Marybelle Killings M.D.   On: 07/09/2018 11:47   Dg  Chest Port 1 View  Result Date: 07/09/2018 CLINICAL DATA:  Endotracheal tube insertion EXAM: PORTABLE CHEST 1 VIEW COMPARISON:  None. FINDINGS: Endotracheal tube tip is 4.2 cm from the carina. The NG tube is coiled in the proximal esophagus. Hazy airspace disease at the left lung base. Right lung is clear. Normal heart size. No pneumothorax. IMPRESSION: Endotracheal tube tip is 4.2 cm from the carina. NG tube is coiled in the upper esophagus. Hazy left basilar airspace disease. Electronically Signed   By: Marybelle Killings M.D.   On: 07/09/2018 10:03   Dg Abd Portable 1 View  Result Date: 07/09/2018 CLINICAL DATA:  NG tube EXAM: PORTABLE ABDOMEN - 1 VIEW COMPARISON:  None. FINDINGS: NG tube tip is in the body of the stomach. There are no disproportionally dilated loops of bowel. There is no obvious free intraperitoneal gas. IMPRESSION: NG tube tip is in the body of the stomach. Electronically Signed   By: Marybelle Killings M.D.   On: 07/09/2018 10:08     Medications:  Scheduled: . artificial tears  1 application Both Eyes Q4O  . chlorhexidine gluconate (MEDLINE KIT)  15 mL Mouth Rinse BID  . Chlorhexidine Gluconate Cloth  6 each Topical Q0600  . fentaNYL (SUBLIMAZE) injection  50 mcg Intravenous Once  . fentaNYL (SUBLIMAZE) injection  50 mcg Intravenous Once  . heparin  5,000 Units Subcutaneous Q8H  . insulin aspart  2-6 Units Subcutaneous Q4H  . ipratropium-albuterol  3 mL Nebulization Q6H  . mouth rinse  15 mL Mouth Rinse 10 times per day  . pantoprazole (PROTONIX) IV  40 mg Intravenous QHS   Continuous: . sodium chloride    . sodium chloride    . acyclovir 635 mg (07/10/18 1217)  . cefTRIAXone (ROCEPHIN)  IV 2 g (07/10/18 1223)  . cisatracurium (NIMBEX) infusion 2 mcg/kg/min (07/10/18 1200)  . fentaNYL infusion INTRAVENOUS 300 mcg/hr (07/10/18 1200)  . levETIRAcetam Stopped (07/10/18 1119)  . norepinephrine (LEVOPHED) Adult infusion 2 mcg/min (07/10/18 1200)  . propofol (DIPRIVAN) infusion  30 mcg/kg/min (07/10/18 1200)  . valproate sodium Stopped (07/10/18 9629)  . vancomycin Stopped (07/10/18 0239)    LTM EEG: This day 1 of continuous EEG monitoring with simultaneous video monitoring did not record any clinical subclinical seizures.  Background activities marked by reactive background activity slowing with superimposed fast frequencies suggestive of encephalopathy of nonspecific etiologies.  Prominent beta frequencies could possibly be related to certain medications including barbiturates, benzodiazepines etc.  Sedation status also can contribute to these findings if patient is sedated.   Assessment: 23 year old male with new onset seizures. Had a headache the day prior to seizure onset. The headache was unusual for him per wife. CT head showed no lesion. Was positive for THC.   1. In ICU on paralytic due to safety considerations in the setting of pneumothorax. Neurological exam not informative at this time.  2. DDx for presentation includes encephalitis,  meningitis, vasculitis, drug-induced, EtOH or benzo withdrawal, post-concussive seizure and PRES.  3. Unable to localize with current exam due to paralytic (Nimbex). Will repeat exam when CCM is able to wean off paralytic.  4. Afebrile with no neck stiffness per EDP, based on their exam prior to paralytic administration for intubation.  5. EEG negative for status epilepticus or intermittent electrographic seizures. Background activities marked by reactive background activity slowing with superimposed fast frequencies suggestive of encephalopathy of nonspecific etiologies. Prominent beta frequencies are most likely related to sedating medications.  Recommendations: 1. STAT LP. Labs to include cell count with differential, protein, glucose, gram stain, bacterial and fungal culture, HSV PCR, cryptococcal antigen, [place] ink stain, IgG index. Discussed with CCM.  2. After LP, obtain MRI brain with contrast.  3. Continue valproic acid  and Keppra.  4. Continue empiric acyclovir, vancomycin and Rocephin 5. LTM EEG is being discontinued  35 minutes spent in the neurological evaluation and management of this critically ill patient. Time spent included coordination of care.     LOS: 1 day   '@Electronically'$  signed: Dr. Kerney Elbe 07/10/2018  12:40 PM

## 2018-07-10 NOTE — Progress Notes (Signed)
Called and updated wife.

## 2018-07-10 NOTE — Procedures (Signed)
Electroencephalogram report.  Long-term video  monitoring  Recording begins 07/09/2018 at 1423 Recording ends 07/10/2018 at 07 30  Day 1  CPT 95720  This continuous EEG monitoring with simultaneous video monitoring was performed for this patient with suspected status epilepticus to ensure resolution of seizures after treatment.  Background activities marked by continuous reactive background activity slowing in the delta range with superimposed faster frequencies in the beta range distributed predominantly anteriorly and paracentrally.  State changes were present.  There was no interictal epileptiform discharges.  No clinical subclinical seizures present.  Clinical interpretation: This day 1 of continuous EEG monitoring with simultaneous video monitoring did not record any clinical subclinical seizures.  Background activities marked by reactive background activity slowing with superimposed fast frequencies suggestive of encephalopathy of nonspecific etiologies.  Prominent beta frequencies could possibly be related to certain medications including barbiturates, benzodiazepines etc.  Sedation status also can contribute to these findings if patient is sedated.  Continuous monitoring is recommended to ensure stability and resolution of seizures.  Clinical correlation is advised.

## 2018-07-11 ENCOUNTER — Inpatient Hospital Stay (HOSPITAL_COMMUNITY): Payer: Medicaid Other

## 2018-07-11 DIAGNOSIS — J9312 Secondary spontaneous pneumothorax: Secondary | ICD-10-CM

## 2018-07-11 LAB — POCT I-STAT 7, (LYTES, BLD GAS, ICA,H+H)
Acid-Base Excess: 2 mmol/L (ref 0.0–2.0)
Bicarbonate: 25.1 mmol/L (ref 20.0–28.0)
Calcium, Ion: 1.18 mmol/L (ref 1.15–1.40)
HCT: 31 % — ABNORMAL LOW (ref 39.0–52.0)
Hemoglobin: 10.5 g/dL — ABNORMAL LOW (ref 13.0–17.0)
O2 Saturation: 90 %
Patient temperature: 98.6
Potassium: 3.5 mmol/L (ref 3.5–5.1)
Sodium: 138 mmol/L (ref 135–145)
TCO2: 26 mmol/L (ref 22–32)
pCO2 arterial: 34.8 mmHg (ref 32.0–48.0)
pH, Arterial: 7.466 — ABNORMAL HIGH (ref 7.350–7.450)
pO2, Arterial: 55 mmHg — ABNORMAL LOW (ref 83.0–108.0)

## 2018-07-11 LAB — MAGNESIUM
Magnesium: 2.1 mg/dL (ref 1.7–2.4)
Magnesium: 1.8 mg/dL (ref 1.7–2.4)

## 2018-07-11 LAB — GLUCOSE, CAPILLARY
Glucose-Capillary: 106 mg/dL — ABNORMAL HIGH (ref 70–99)
Glucose-Capillary: 102 mg/dL — ABNORMAL HIGH (ref 70–99)
Glucose-Capillary: 74 mg/dL (ref 70–99)
Glucose-Capillary: 97 mg/dL (ref 70–99)
Glucose-Capillary: 90 mg/dL (ref 70–99)
Glucose-Capillary: 111 mg/dL — ABNORMAL HIGH (ref 70–99)

## 2018-07-11 LAB — CBC
HCT: 36.4 % — ABNORMAL LOW (ref 39.0–52.0)
Hemoglobin: 12.6 g/dL — ABNORMAL LOW (ref 13.0–17.0)
MCH: 30.7 pg (ref 26.0–34.0)
MCHC: 34.6 g/dL (ref 30.0–36.0)
MCV: 88.6 fL (ref 80.0–100.0)
Platelets: 128 10*3/uL — ABNORMAL LOW (ref 150–400)
RBC: 4.11 MIL/uL — ABNORMAL LOW (ref 4.22–5.81)
RDW: 13.4 % (ref 11.5–15.5)
WBC: 8 10*3/uL (ref 4.0–10.5)
nRBC: 0 % (ref 0.0–0.2)

## 2018-07-11 LAB — CSF CELL COUNT WITH DIFFERENTIAL
RBC Count, CSF: 2 /mm3 — ABNORMAL HIGH
Tube #: 3
WBC, CSF: 3 /mm3 (ref 0–5)

## 2018-07-11 LAB — PROTEIN AND GLUCOSE, CSF
Glucose, CSF: 64 mg/dL (ref 40–70)
Total  Protein, CSF: 43 mg/dL (ref 15–45)

## 2018-07-11 LAB — BASIC METABOLIC PANEL
Anion gap: 10 (ref 5–15)
BUN: 15 mg/dL (ref 6–20)
CO2: 24 mmol/L (ref 22–32)
Calcium: 8.4 mg/dL — ABNORMAL LOW (ref 8.9–10.3)
Chloride: 103 mmol/L (ref 98–111)
Creatinine, Ser: 0.94 mg/dL (ref 0.61–1.24)
GFR calc Af Amer: >60 mL/min (ref >60)
GFR calc non Af Amer: >60 mL/min (ref >60)
Glucose, Bld: 95 mg/dL (ref 70–99)
Potassium: 3.8 mmol/L (ref 3.5–5.1)
Sodium: 137 mmol/L (ref 135–145)

## 2018-07-11 LAB — PHOSPHORUS
Phosphorus: 2.6 mg/dL (ref 2.5–4.6)
Phosphorus: 1.6 mg/dL — ABNORMAL LOW (ref 2.5–4.6)

## 2018-07-11 LAB — LEGIONELLA PNEUMOPHILA SEROGP 1 UR AG: L. pneumophila Serogp 1 Ur Ag: NEGATIVE

## 2018-07-11 LAB — PROCALCITONIN: Procalcitonin: 8.21 ng/mL

## 2018-07-11 LAB — MRSA PCR SCREENING: MRSA by PCR: NEGATIVE

## 2018-07-11 LAB — TRIGLYCERIDES: Triglycerides: 122 mg/dL (ref <150)

## 2018-07-11 MED ORDER — SODIUM CHLORIDE 0.9% FLUSH: 10.0000 mL | INTRAVENOUS | Status: DC | PRN

## 2018-07-11 MED ORDER — MIDAZOLAM HCL 2 MG/2ML IJ SOLN
2.0000 mg | Freq: Once | INTRAMUSCULAR | Status: AC
  Administered 2018-07-11: 2 mg via INTRAVENOUS

## 2018-07-11 MED ORDER — PRO-STAT SUGAR FREE PO LIQD
30.0000 mL | Freq: Two times a day (BID) | ORAL | Status: DC
  Administered 2018-07-11 (×2): 30 mL
  Filled 2018-07-11 (×3): qty 30

## 2018-07-11 MED ORDER — VITAL AF 1.2 CAL PO LIQD
1000.0000 mL | ORAL | Status: DC
  Administered 2018-07-12: 1000 mL

## 2018-07-11 MED ORDER — VITAL HIGH PROTEIN PO LIQD
1000.0000 mL | ORAL | Status: AC
  Administered 2018-07-11: 1000 mL

## 2018-07-11 MED ORDER — SODIUM CHLORIDE 0.9% FLUSH
10.0000 mL | Freq: Two times a day (BID) | INTRAVENOUS | Status: DC
  Administered 2018-07-12: 30 mL
  Administered 2018-07-12: 10 mL

## 2018-07-11 MED ORDER — CHLORHEXIDINE GLUCONATE CLOTH 2 % EX PADS
6.0000 | MEDICATED_PAD | Freq: Every day | CUTANEOUS | Status: DC
  Administered 2018-07-12 – 2018-07-16 (×5): 6 via TOPICAL
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
## 2018-07-11 NOTE — Progress Notes (Signed)
Initial Nutrition Assessment  DOCUMENTATION CODES:   Not applicable  INTERVENTION:   D/C Vital High Protein  Vital AF 1.2 @ 50 ml/hr 30 ml Prostat BID  Provides: 1640 kcal, 120 grams protein, and 973 ml free water TF regimen and propofol at current rate providing 2036 total kcal/day    NUTRITION DIAGNOSIS:   Inadequate oral intake related to inability to eat as evidenced by NPO status.  GOAL:   Patient will meet greater than or equal to 90% of their needs  MONITOR:   Vent status, TF tolerance  REASON FOR ASSESSMENT:   Consult, Ventilator Enteral/tube feeding initiation and management  ASSESSMENT:   Pt with no known PMH who was intubated 5/30 on admission for seizure like activity. Pt was positive for opioids, benzos, and THC.    5/30 pt had bilateral chest tubes placed for expanding pneumothoraces  Pt currently paralyzed on vent. Per MD SIRS/sepsis with aspiration PNA, possible meningitis. LP planned for today.   Patient is currently intubated on ventilator support MV: 9.7 L/min Temp (24hrs), Avg:99.2 F (37.3 C), Min:98.4 F (36.9 C), Max:100 F (37.8 C) MAP: 72  Propofol: 15 ml/hr (40 mcg) = 396 kcal  Medications reviewed and include: 2-6 units novolog every 4 hours Labs reviewed CT 1/2: 26 ml output  OG tube in place   NUTRITION - FOCUSED PHYSICAL EXAM:  Deferred   Diet Order:   Diet Order            Diet NPO time specified  Diet effective now              EDUCATION NEEDS:   No education needs have been identified at this time  Skin:  Skin Assessment: Reviewed RN Assessment  Last BM:  unknown  Height:   Ht Readings from Last 1 Encounters:  07/09/18 5\' 10"  (1.778 m)    Weight:   Wt Readings from Last 1 Encounters:  07/11/18 73.6 kg    Ideal Body Weight:  75.4 kg  BMI:  Body mass index is 23.28 kg/m.  Estimated Nutritional Needs:   Kcal:  2084  Protein:  105-130 grams  Fluid:  > 2L /day  Kendell Bane RD, LDN,  CNSC [phone] Pager [phone] After Hours Pager

## 2018-07-11 NOTE — Progress Notes (Signed)
Nimbex gtt discontinued per MD.
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
## 2018-07-11 NOTE — Progress Notes (Signed)
Arterial line removed per MD. No blood return and poor waveform.

## 2018-07-11 NOTE — Progress Notes (Signed)
ABG values given to Dr. Adaline Sill. PaO2 55 with a sat of 90%. RT and MD discussed not weaning FIO2 or Peep any lower at this time.  Per MD, ok to maintain current vent settings.

## 2018-07-11 NOTE — Progress Notes (Signed)
Spoke with patient's wife on the phone. She states he is very afraid of hospitals and usually refuses medical care. She has asked if she could be on video chat when the patient is extubated. She states she is the only person who can keep him calm. She says he will likely become violent. CCM is aware.

## 2018-07-11 NOTE — Progress Notes (Signed)
Called [facility] and made them aware of pt's poor urine output. Discuss pt's reactions to being reposition which includes ST followed by SB.

## 2018-07-11 NOTE — Procedures (Signed)
Lumbar Puncture Procedure Note  Pre-operative Diagnosis: Status epilepticus (HCC)  Post-operative Diagnosis: Status epilepticus [facility])  Indications: Diagnostic  Procedure Details   Consent: Informed consent was obtained. Risks of the procedure were discussed including: infection, bleeding, pain and headache.  The patient was positioned under sterile conditions. Betadine solution and sterile drapes were utilized. A spinal needle was inserted at the L4 - L5 interspace.  Spinal fluid was obtained and sent to the laboratory.  Findings 27mL of clear spinal fluid was obtained.   Complications:  None; patient tolerated the procedure well.        Condition: stable

## 2018-07-11 NOTE — Progress Notes (Signed)
1538 spoke with CCM NP. Patient becoming very agitated with any stimulation. Oxygen saturation in 80's when agitated. Orders for 2mg  of versed and to titrate propofol up per order parameters.

## 2018-07-12 ENCOUNTER — Inpatient Hospital Stay (HOSPITAL_COMMUNITY): Payer: Medicaid Other

## 2018-07-12 ENCOUNTER — Encounter (HOSPITAL_COMMUNITY): Payer: Self-pay | Admitting: *Deleted

## 2018-07-12 DIAGNOSIS — J939 Pneumothorax, unspecified: Secondary | ICD-10-CM

## 2018-07-12 DIAGNOSIS — J690 Pneumonitis due to inhalation of food and vomit: Secondary | ICD-10-CM

## 2018-07-12 LAB — CULTURE, RESPIRATORY W GRAM STAIN: Culture: NORMAL

## 2018-07-12 LAB — HERPES SIMPLEX VIRUS(HSV) DNA BY PCR: HSV 2 DNA: NEGATIVE

## 2018-07-12 LAB — CBC
HCT: 32.8 % — ABNORMAL LOW (ref 39.0–52.0)
Hemoglobin: 11.1 g/dL — ABNORMAL LOW (ref 13.0–17.0)
MCH: 29.8 pg (ref 26.0–34.0)
MCHC: 33.8 g/dL (ref 30.0–36.0)
MCV: 88.2 fL (ref 80.0–100.0)
Platelets: 126 10*3/uL — ABNORMAL LOW (ref 150–400)
RBC: 3.72 MIL/uL — ABNORMAL LOW (ref 4.22–5.81)
RDW: 13.7 % (ref 11.5–15.5)
WBC: 4 10*3/uL (ref 4.0–10.5)
nRBC: 0 % (ref 0.0–0.2)

## 2018-07-12 LAB — GLUCOSE, CAPILLARY
Glucose-Capillary: 111 mg/dL — ABNORMAL HIGH (ref 70–99)
Glucose-Capillary: 133 mg/dL — ABNORMAL HIGH (ref 70–99)
Glucose-Capillary: 125 mg/dL — ABNORMAL HIGH (ref 70–99)

## 2018-07-12 LAB — BASIC METABOLIC PANEL
Anion gap: 10 (ref 5–15)
BUN: 14 mg/dL (ref 6–20)
CO2: 26 mmol/L (ref 22–32)
Calcium: 8.4 mg/dL — ABNORMAL LOW (ref 8.9–10.3)
Chloride: 101 mmol/L (ref 98–111)
Creatinine, Ser: 0.81 mg/dL (ref 0.61–1.24)
GFR calc Af Amer: >60 mL/min (ref >60)
GFR calc non Af Amer: >60 mL/min (ref >60)
Glucose, Bld: 115 mg/dL — ABNORMAL HIGH (ref 70–99)
Potassium: 3 mmol/L — ABNORMAL LOW (ref 3.5–5.1)
Sodium: 137 mmol/L (ref 135–145)

## 2018-07-12 LAB — PHOSPHORUS
Phosphorus: 1.8 mg/dL — ABNORMAL LOW (ref 2.5–4.6)
Phosphorus: 3.1 mg/dL (ref 2.5–4.6)

## 2018-07-12 LAB — HSV DNA BY PCR (REFERENCE LAB): HSV 1 DNA: NEGATIVE

## 2018-07-12 LAB — TRIGLYCERIDES: Triglycerides: 79 mg/dL (ref <150)

## 2018-07-12 LAB — MAGNESIUM
Magnesium: 1.7 mg/dL (ref 1.7–2.4)
Magnesium: 1.7 mg/dL (ref 1.7–2.4)

## 2018-07-12 MED ORDER — PNEUMOCOCCAL VAC POLYVALENT 25 MCG/0.5ML IJ INJ
0.5000 mL | INJECTION | INTRAMUSCULAR | Status: AC
  Administered 2018-07-13: 10:00:00 0.5 mL via INTRAMUSCULAR
  Filled 2018-07-12: qty 0.5

## 2018-07-12 MED ORDER — LORAZEPAM 2 MG/ML IJ SOLN
2.0000 mg | Freq: Once | INTRAMUSCULAR | Status: AC
  Administered 2018-07-12: 2 mg via INTRAVENOUS

## 2018-07-12 MED ORDER — LORAZEPAM 2 MG/ML IJ SOLN
INTRAMUSCULAR | Status: AC
  Filled 2018-07-12: qty 1

## 2018-07-12 MED ORDER — SODIUM CHLORIDE 0.9 % IV SOLN
INTRAVENOUS | Status: DC | PRN
  Administered 2018-07-12: 500 mL via INTRAVENOUS

## 2018-07-12 MED ORDER — DEXMEDETOMIDINE HCL IN NACL 200 MCG/50ML IV SOLN
0.4000 ug/kg/h | INTRAVENOUS | Status: DC
  Administered 2018-07-12 (×2): 2 ug/kg/h via INTRAVENOUS
  Filled 2018-07-12 (×2): qty 50

## 2018-07-12 MED ORDER — ZIPRASIDONE MESYLATE 20 MG IM SOLR
20.0000 mg | INTRAMUSCULAR | Status: DC | PRN
  Filled 2018-07-12: qty 20

## 2018-07-12 MED ORDER — MIDAZOLAM HCL 2 MG/2ML IJ SOLN
2.0000 mg | INTRAMUSCULAR | Status: DC | PRN
  Administered 2018-07-13 (×2): 2 mg via INTRAVENOUS
  Filled 2018-07-12 (×3): qty 2

## 2018-07-12 MED ORDER — MIDAZOLAM HCL 2 MG/2ML IJ SOLN
INTRAMUSCULAR | Status: AC
  Administered 2018-07-12: 4 mg via INTRAVENOUS
  Filled 2018-07-12: qty 4

## 2018-07-12 MED ORDER — DEXMEDETOMIDINE HCL IN NACL 200 MCG/50ML IV SOLN
0.0000 ug/kg/h | INTRAVENOUS | Status: DC
  Administered 2018-07-12: 0.4 ug/kg/h via INTRAVENOUS
  Administered 2018-07-12 (×2): 1.2 ug/kg/h via INTRAVENOUS
  Filled 2018-07-12 (×4): qty 50

## 2018-07-12 MED ORDER — ORAL CARE MOUTH RINSE
15.0000 mL | Freq: Two times a day (BID) | OROMUCOSAL | Status: DC
  Administered 2018-07-12 – 2018-07-15 (×6): 15 mL via OROMUCOSAL

## 2018-07-12 MED ORDER — MIDAZOLAM HCL 2 MG/2ML IJ SOLN
4.0000 mg | Freq: Once | INTRAMUSCULAR | Status: AC
  Administered 2018-07-12: 19:00:00 4 mg via INTRAVENOUS

## 2018-07-12 MED ORDER — DEXMEDETOMIDINE HCL IN NACL 400 MCG/100ML IV SOLN
0.4000 ug/kg/h | INTRAVENOUS | Status: DC
  Administered 2018-07-13: 1.8 ug/kg/h via INTRAVENOUS
  Administered 2018-07-13: 1.9 ug/kg/h via INTRAVENOUS
  Filled 2018-07-12 (×3): qty 100

## 2018-07-12 MED ORDER — POTASSIUM CHLORIDE 10 MEQ/100ML IV SOLN: 10.0000 meq | INTRAVENOUS | Status: DC

## 2018-07-12 MED ORDER — POTASSIUM CHLORIDE 20 MEQ/15ML (10%) PO SOLN
40.0000 meq | Freq: Once | ORAL | Status: AC
  Administered 2018-07-12: 09:00:00 40 meq
  Filled 2018-07-12: qty 30

## 2018-07-12 MED ORDER — IPRATROPIUM-ALBUTEROL 0.5-2.5 (3) MG/3ML IN SOLN
3.0000 mL | Freq: Three times a day (TID) | RESPIRATORY_TRACT | Status: DC
  Administered 2018-07-12 – 2018-07-14 (×5): 3 mL via RESPIRATORY_TRACT
  Filled 2018-07-12 (×5): qty 3

## 2018-07-12 MED ORDER — ZIPRASIDONE MESYLATE 20 MG IM SOLR
20.0000 mg | Freq: Once | INTRAMUSCULAR | Status: DC
  Filled 2018-07-12 (×2): qty 20

## 2018-07-12 MED ORDER — POTASSIUM PHOSPHATES 15 MMOLE/5ML IV SOLN
20.0000 mmol | Freq: Once | INTRAVENOUS | Status: AC
  Administered 2018-07-12: 20 mmol via INTRAVENOUS
  Filled 2018-07-12: qty 6.67

## 2018-07-12 NOTE — Procedures (Signed)
Extubation Procedure Note  Patient Details:   Name: Chris Carter DOB: [birth year] MRN: [id number]   Airway Documentation:    Vent end date: 07/12/18 Vent end time: 0935   Evaluation  O2 sats: stable throughout Complications: No apparent complications Patient did tolerate procedure well. Bilateral Breath Sounds: Diminished, Clear   Yes   Positive cuff leak. Placed on HFNC 8L; sats 92%. No stidor noted. Patient able to reach 625 mL using incentive.   Phoenyx Paulsen A Baley Shands 07/12/2018, 10:19 AM

## 2018-07-12 NOTE — Progress Notes (Signed)
1820 responded to bed alarm. Patient attempting to get out of bed. Patient became combative, punching and kicking. MD paged. Security paged. See MAR for medication administration. Stat Chest xray ordered. Chest tubes placed back to suction. Patient placed on non-re breather. Obtained order for ankle restraints. Called patients wife and updated her.
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
## 2018-07-13 ENCOUNTER — Inpatient Hospital Stay (HOSPITAL_COMMUNITY): Payer: Medicaid Other

## 2018-07-13 DIAGNOSIS — J939 Pneumothorax, unspecified: Secondary | ICD-10-CM

## 2018-07-13 DIAGNOSIS — Z4682 Encounter for fitting and adjustment of non-vascular catheter: Secondary | ICD-10-CM

## 2018-07-13 DIAGNOSIS — J690 Pneumonitis due to inhalation of food and vomit: Secondary | ICD-10-CM

## 2018-07-13 LAB — CBC
HCT: 35.2 % — ABNORMAL LOW (ref 39.0–52.0)
Hemoglobin: 12.1 g/dL — ABNORMAL LOW (ref 13.0–17.0)
MCH: 29.8 pg (ref 26.0–34.0)
MCHC: 34.4 g/dL (ref 30.0–36.0)
MCV: 86.7 fL (ref 80.0–100.0)
Platelets: 127 10*3/uL — ABNORMAL LOW (ref 150–400)
RBC: 4.06 MIL/uL — ABNORMAL LOW (ref 4.22–5.81)
RDW: 13.4 % (ref 11.5–15.5)
WBC: 5.2 10*3/uL (ref 4.0–10.5)
nRBC: 0 % (ref 0.0–0.2)

## 2018-07-13 LAB — AMMONIA: Ammonia: 36 umol/L — ABNORMAL HIGH (ref 9–35)

## 2018-07-13 LAB — VALPROIC ACID LEVEL: Valproic Acid Lvl: 48 ug/mL — ABNORMAL LOW (ref 50.0–100.0)

## 2018-07-13 MED ORDER — HALOPERIDOL LACTATE 5 MG/ML IJ SOLN
5.0000 mg | Freq: Four times a day (QID) | INTRAMUSCULAR | Status: DC
  Administered 2018-07-13: 09:00:00 5 mg via INTRAVENOUS
  Filled 2018-07-13: qty 1

## 2018-07-13 MED ORDER — THIAMINE HCL 100 MG/ML IJ SOLN
100.0000 mg | Freq: Every day | INTRAMUSCULAR | Status: DC
  Administered 2018-07-13 – 2018-07-15 (×3): 100 mg via INTRAVENOUS
  Filled 2018-07-13 (×3): qty 2

## 2018-07-13 MED ORDER — FOLIC ACID 5 MG/ML IJ SOLN
1.0000 mg | Freq: Every day | INTRAMUSCULAR | Status: DC
  Administered 2018-07-13 – 2018-07-15 (×3): 1 mg via INTRAVENOUS
  Filled 2018-07-13 (×3): qty 0.2

## 2018-07-13 MED ORDER — NICOTINE 21 MG/24HR TD PT24
21.0000 mg | MEDICATED_PATCH | Freq: Every day | TRANSDERMAL | Status: DC
  Administered 2018-07-13 – 2018-07-15 (×3): 21 mg via TRANSDERMAL
  Filled 2018-07-13 (×4): qty 1

## 2018-07-13 MED ORDER — HALOPERIDOL LACTATE 5 MG/ML IJ SOLN
5.0000 mg | INTRAMUSCULAR | Status: DC | PRN
  Administered 2018-07-14: 5 mg via INTRAVENOUS
  Filled 2018-07-13 (×2): qty 1

## 2018-07-13 MED ORDER — LORAZEPAM 2 MG/ML IJ SOLN: 1.0000 mg | INTRAMUSCULAR | Status: DC | PRN

## 2018-07-13 MED ORDER — ENOXAPARIN SODIUM 40 MG/0.4ML ~~LOC~~ SOLN
40.0000 mg | SUBCUTANEOUS | Status: DC
  Administered 2018-07-14 – 2018-07-16 (×3): 40 mg via SUBCUTANEOUS
  Filled 2018-07-13 (×3): qty 0.4

## 2018-07-13 MED ORDER — FENTANYL CITRATE (PF) 100 MCG/2ML IJ SOLN
25.0000 ug | INTRAMUSCULAR | Status: DC | PRN
  Administered 2018-07-14 (×2): 25 ug via INTRAVENOUS
  Filled 2018-07-13 (×2): qty 2

## 2018-07-13 MED ORDER — CLONIDINE HCL 0.2 MG/24HR TD PTWK
0.2000 mg | MEDICATED_PATCH | TRANSDERMAL | Status: DC
  Administered 2018-07-13: 0.2 mg via TRANSDERMAL
  Filled 2018-07-13: qty 1

## 2018-07-13 MED ORDER — KCL-LACTATED RINGERS-D5W 20 MEQ/L IV SOLN
INTRAVENOUS | Status: DC
  Administered 2018-07-13 – 2018-07-14 (×2): via INTRAVENOUS
  Filled 2018-07-13 (×4): qty 1000
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
## 2018-07-14 ENCOUNTER — Inpatient Hospital Stay (HOSPITAL_COMMUNITY): Payer: Medicaid Other

## 2018-07-14 LAB — CBC
HCT: 37.1 % — ABNORMAL LOW (ref 39.0–52.0)
Hemoglobin: 12.9 g/dL — ABNORMAL LOW (ref 13.0–17.0)
MCH: 30.3 pg (ref 26.0–34.0)
MCHC: 34.8 g/dL (ref 30.0–36.0)
MCV: 87.1 fL (ref 80.0–100.0)
Platelets: 194 10*3/uL (ref 150–400)
RBC: 4.26 MIL/uL (ref 4.22–5.81)
RDW: 13.4 % (ref 11.5–15.5)
WBC: 7.4 10*3/uL (ref 4.0–10.5)
nRBC: 0 % (ref 0.0–0.2)

## 2018-07-14 LAB — CULTURE, BLOOD (ROUTINE X 2)
Culture: NO GROWTH
Culture: NO GROWTH
Special Requests: ADEQUATE
Special Requests: ADEQUATE

## 2018-07-14 LAB — BENZODIAZEPINES,MS,WB/SP RFX
7-Aminoclonazepam: NEGATIVE ng/mL
Alprazolam: NEGATIVE ng/mL
Benzodiazepines Confirm: POSITIVE
Chlordiazepoxide: NEGATIVE ng/mL
Clonazepam: NEGATIVE ng/mL
Desalkylflurazepam: NEGATIVE ng/mL
Desmethylchlordiazepoxide: NEGATIVE ng/mL
Desmethyldiazepam: NEGATIVE ng/mL
Diazepam: NEGATIVE ng/mL
Flurazepam: NEGATIVE ng/mL
Lorazepam: NEGATIVE ng/mL
Midazolam: 4 ng/mL
Oxazepam: NEGATIVE ng/mL
Temazepam: NEGATIVE ng/mL
Triazolam: NEGATIVE ng/mL

## 2018-07-14 LAB — BASIC METABOLIC PANEL
Anion gap: 10 (ref 5–15)
BUN: 13 mg/dL (ref 6–20)
CO2: 21 mmol/L — ABNORMAL LOW (ref 22–32)
Calcium: 8.5 mg/dL — ABNORMAL LOW (ref 8.9–10.3)
Chloride: 106 mmol/L (ref 98–111)
Creatinine, Ser: 0.76 mg/dL (ref 0.61–1.24)
GFR calc Af Amer: >60 mL/min (ref >60)
GFR calc non Af Amer: >60 mL/min (ref >60)
Glucose, Bld: 94 mg/dL (ref 70–99)
Potassium: 3.8 mmol/L (ref 3.5–5.1)
Sodium: 137 mmol/L (ref 135–145)

## 2018-07-14 LAB — CSF CULTURE W GRAM STAIN: Culture: NO GROWTH

## 2018-07-14 MED ORDER — LOPERAMIDE HCL 2 MG PO CAPS
2.0000 mg | ORAL_CAPSULE | ORAL | Status: DC | PRN
  Administered 2018-07-14: 2 mg via ORAL
  Filled 2018-07-14: qty 1

## 2018-07-14 MED ORDER — MIDAZOLAM HCL 2 MG/2ML IJ SOLN
2.0000 mg | Freq: Once | INTRAMUSCULAR | Status: AC
  Administered 2018-07-14: 2 mg via INTRAVENOUS
  Filled 2018-07-14: qty 2

## 2018-07-14 MED ORDER — ZIPRASIDONE MESYLATE 20 MG IM SOLR
20.0000 mg | Freq: Once | INTRAMUSCULAR | Status: AC
  Administered 2018-07-14: 20 mg via INTRAMUSCULAR

## 2018-07-14 NOTE — Progress Notes (Signed)
Initial Nutrition Assessment  DOCUMENTATION CODES:   Not applicable  INTERVENTION:   Magic cup TID with meals, each supplement provides 290 kcal and 9 grams of protein  NUTRITION DIAGNOSIS:   Inadequate oral intake related to acute illness as evidenced by meal completion < 50%. Ongoing.   GOAL:   Patient will meet greater than or equal to 90% of their needs Progressing.   MONITOR:   PO intake, Supplement acceptance  REASON FOR ASSESSMENT:   Consult, Ventilator Enteral/tube feeding initiation and management  ASSESSMENT:   Pt with no known PMH who was intubated 5/30 on admission for seizure like activity. Pt was positive for opioids, benzos, and THC.    Chest tubes removed  5/30 pt had bilateral chest tubes placed for expanding pneumothoraces 6/3 extubated 6/4 started on Regular diet this afternoon  Medications reviewed and include: thiamine  Labs reviewed  NUTRITION - FOCUSED PHYSICAL EXAM:  Deferred   Diet Order:   Diet Order            Diet regular Room service appropriate? Yes; Fluid consistency: Thin  Diet effective now              EDUCATION NEEDS:   No education needs have been identified at this time  Skin:  Skin Assessment: Reviewed RN Assessment  Last BM:  6/4  Height:   Ht Readings from Last 1 Encounters:  07/09/18 5\' 10"  (1.778 m)    Weight:   Wt Readings from Last 1 Encounters:  07/14/18 71.5 kg    Ideal Body Weight:  75.4 kg  BMI:  Body mass index is 22.62 kg/m.  Estimated Nutritional Needs:   Kcal:  2100-2300  Protein:  105-130 grams  Fluid:  > 2L /day  Kendell Bane RD, LDN, CNSC [phone] Pager [phone] After Hours Pager
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
## 2018-07-14 NOTE — Progress Notes (Signed)
At 1530, while pt was talking with his wife on his cell phone, he became increasingly agitated and was demanding to leave AMA. At 1549, received 5mg  Haldol. Pt became more aggressive and jumped out of bed and starting running naked through the unit. He was incontinent of liquid stool. Multiple staff members tried to calm him down but he was VERY AGGRESSIVE towards everyone. He fell multiple times and ran into the walls multiple times. He ran into a pt's room on 4N and continue through 4NP. Security arrived on the unit and chased him up the stairwell to 6N where they were able to capture him and put him in wheelchair and brought him back to his room. Dr. Isaiah Serge was present for the event. Meds were given. Once pt was calm, he was cleaned thoroughly since we was covered in stool. Dr. Isaiah Serge updated Susann Givens, pt's wife.

## 2018-07-15 ENCOUNTER — Inpatient Hospital Stay (HOSPITAL_COMMUNITY): Payer: Medicaid Other

## 2018-07-15 ENCOUNTER — Encounter (HOSPITAL_COMMUNITY): Payer: Self-pay

## 2018-07-15 DIAGNOSIS — J9311 Primary spontaneous pneumothorax: Secondary | ICD-10-CM

## 2018-07-15 LAB — CBC
HCT: 37.7 % — ABNORMAL LOW (ref 39.0–52.0)
Hemoglobin: 13 g/dL (ref 13.0–17.0)
MCH: 29.8 pg (ref 26.0–34.0)
MCHC: 34.5 g/dL (ref 30.0–36.0)
MCV: 86.5 fL (ref 80.0–100.0)
Platelets: 209 10*3/uL (ref 150–400)
RBC: 4.36 MIL/uL (ref 4.22–5.81)
RDW: 13.4 % (ref 11.5–15.5)
WBC: 8.2 10*3/uL (ref 4.0–10.5)
nRBC: 0 % (ref 0.0–0.2)

## 2018-07-15 LAB — DRUG SCREEN 10 W/CONF, SERUM
Amphetamines, IA: NEGATIVE ng/mL
Barbiturates, IA: NEGATIVE ug/mL
Benzodiazepines, IA: POSITIVE ng/mL — AB
Cocaine & Metabolite, IA: NEGATIVE ng/mL
Methadone, IA: NEGATIVE ng/mL
Opiates, IA: NEGATIVE ng/mL
Oxycodones, IA: NEGATIVE ng/mL
Phencyclidine, IA: NEGATIVE ng/mL
Propoxyphene, IA: NEGATIVE ng/mL
THC(Marijuana) Metabolite, IA: POSITIVE ng/mL — AB

## 2018-07-15 LAB — THC,MS,WB/SP RFX
Cannabidiol: NEGATIVE ng/mL
Cannabinoid Confirmation: POSITIVE
Cannabinol: NEGATIVE ng/mL
Carboxy-THC: 134.7 ng/mL
Hydroxy-THC: 2 ng/mL
Tetrahydrocannabinol(THC): 3.1 ng/mL

## 2018-07-15 LAB — BASIC METABOLIC PANEL
Anion gap: 11 (ref 5–15)
BUN: 13 mg/dL (ref 6–20)
CO2: 22 mmol/L (ref 22–32)
Calcium: 8.7 mg/dL — ABNORMAL LOW (ref 8.9–10.3)
Chloride: 106 mmol/L (ref 98–111)
Creatinine, Ser: 0.77 mg/dL (ref 0.61–1.24)
GFR calc Af Amer: >60 mL/min (ref >60)
GFR calc non Af Amer: >60 mL/min (ref >60)
Glucose, Bld: 95 mg/dL (ref 70–99)
Potassium: 3.3 mmol/L — ABNORMAL LOW (ref 3.5–5.1)
Sodium: 139 mmol/L (ref 135–145)

## 2018-07-15 LAB — MAGNESIUM: Magnesium: 1.7 mg/dL (ref 1.7–2.4)

## 2018-07-15 LAB — VALPROIC ACID LEVEL: Valproic Acid Lvl: 48 ug/mL — ABNORMAL LOW (ref 50.0–100.0)

## 2018-07-15 LAB — PHOSPHORUS: Phosphorus: 4 mg/dL (ref 2.5–4.6)

## 2018-07-15 MED ORDER — LEVETIRACETAM 500 MG PO TABS
1000.0000 mg | ORAL_TABLET | Freq: Two times a day (BID) | ORAL | Status: DC
  Administered 2018-07-15 – 2018-07-16 (×2): 1000 mg via ORAL
  Filled 2018-07-15 (×2): qty 2

## 2018-07-15 MED ORDER — VITAMIN B-1 100 MG PO TABS
100.0000 mg | ORAL_TABLET | Freq: Every day | ORAL | Status: DC
  Administered 2018-07-16: 100 mg via ORAL
  Filled 2018-07-15: qty 1

## 2018-07-15 MED ORDER — VALPROIC ACID 250 MG PO CAPS
500.0000 mg | ORAL_CAPSULE | Freq: Three times a day (TID) | ORAL | Status: DC
  Administered 2018-07-15 – 2018-07-16 (×3): 500 mg via ORAL
  Filled 2018-07-15 (×4): qty 2

## 2018-07-15 MED ORDER — FOLIC ACID 1 MG PO TABS
1.0000 mg | ORAL_TABLET | Freq: Every day | ORAL | Status: DC
  Administered 2018-07-16: 09:00:00 1 mg via ORAL
  Filled 2018-07-15: qty 1

## 2018-07-15 NOTE — Progress Notes (Signed)
Patient transferred from 4N to 3 [facility] Room 27 via wheelchair accompanied by Designer, television/film set.  Patient oriented to room, call bell within reach, VSS.

## 2018-07-15 NOTE — Progress Notes (Addendum)
NEUROLOGY PROGRESS NOTE  Subjective: Patient comfortably sitting in chair.  Has no complaints.  Alert and oriented.  Exam: Vitals:   07/15/18 1200 07/15/18 1300  BP: 113/64 (!) 95/45  Pulse: 83 64  Resp: (!) 31 (!) 37  Temp:    SpO2: 97% 99%    Physical Exam   HEENT-  Normocephalic, no lesions, without obvious abnormality.  Normal external eye and conjunctiva.   Extremities- Warm, dry and intact Musculoskeletal-no joint tenderness, deformity or swelling Skin-warm and dry, no hyperpigmentation, vitiligo, or suspicious lesions    Neuro:  Mental Status: Alert, oriented, thought content appropriate.  Speech fluent without evidence of aphasia.  Able to follow 3 step commands without difficulty. Cranial Nerves: II:  Visual fields grossly normal,  III,IV, VI: ptosis not present, extra-ocular motions intact bilaterally pupils equal, round, reactive to light and accommodation V,VII: smile symmetric, facial light touch sensation normal bilaterally VIII: hearing normal bilaterally IX,X: Palate rises midline XI: bilateral shoulder shrug XII: midline tongue extension Motor: Right : Upper extremity   5/5    Left:     Upper extremity   5/5  Lower extremity   5/5     Lower extremity   5/5 Tone and bulk:normal tone throughout; no atrophy noted Sensory: Pinprick and light touch intact throughout, bilaterally Deep Tendon Reflexes: 2+ and symmetric throughout Plantars: Right: downgoing   Left: downgoing Cerebellar: normal finger-to-nose,     Medications:  Scheduled: . Chlorhexidine Gluconate Cloth  6 each Topical Daily  . cloNIDine  0.2 mg Transdermal Weekly  . enoxaparin (LOVENOX) injection  40 mg Subcutaneous Q24H  . [START ON 07/16/2018] folic acid  1 mg Oral Daily  . levETIRAcetam  1,000 mg Oral BID  . mouth rinse  15 mL Mouth Rinse BID  . nicotine  21 mg Transdermal Daily  . [START ON 07/16/2018] thiamine  100 mg Oral Daily  . valproic acid  500 mg Oral TID   Continuous: .  sodium chloride Stopped (07/13/18 0129)    Pertinent Labs/Diagnostics:   Mr Brain Wo Contrast  Result Date: 07/14/2018  IMPRESSION: 1. Motion degraded and discontinued examination. 2. No acute intracranial abnormality demonstrated on the available sequences. 3. Moderate sphenoid and maxillary sinus mucosal disease. Electronically Signed   By: Deatra Robinson M.D.   On: 07/14/2018 22:56        Felicie Morn PA-C [facility] [phone]   Assessment: 23 year old male with past medical history of head trauma with new onset status epilepticus.  Patient developed bilateral pneumothorax.  At this point time he is alert and oriented showing no discomfort.  Patient has had EEG which did not show any seizure activity.  He is also had an LP which did not show any significant abnormalities. Impression:  -New onset seizure  Recommendations: - Continue current dose of Depakote -Patient will need to follow-up with neurology as an outpatient - Seizure precuations -Per [facility] statutes, patients with seizures are not allowed to drive until  they have been seizure-free for six months. Use caution when using heavy equipment or power tools. Avoid working on ladders or at heights. Take showers instead of baths. Ensure the water temperature is not too high on the home water heater. Do not go swimming alone. When caring for infants or small children, sit down when holding, feeding, or changing them to minimize risk of injury to the child in the event you have a seizure.   Also, Maintain good sleep hygiene. Avoid alcohol.  07/15/2018, 2:01 PM    NEUROHOSPITALIST ADDENDUM Performed a face to face diagnostic evaluation.   I have reviewed the contents of history and physical exam as documented by PA/ARNP/Resident and agree with above documentation.  I have discussed and formulated the above plan as documented. Edits to the note have been made as needed.  Seizures. Reviewed MRI  Brain, motion restricted. No obvious acute findings.  Contrast not performed.  Recommendations Continue Depakote Follow-up with neurologist as outpatient       MD [facility] [phone]   If 7pm to 7am, please call on call as listed on AMION.

## 2018-07-15 NOTE — Progress Notes (Signed)
PROGRESS NOTE    Chris Carter  XLK:[id number] DOB: [birth year] DOA: 07/09/2018 PCP: Mariel Sleet, PA-C  Brief Narrative: 23 year old male with no known medical history presented to the ER via EMS after witnessed seizure-like activity. No known seizure history.Required intubation in route however patient self extubated and required reintubation in the emergency room. Patient remained intubated from 07/09/2018 to 07/12/2018, EEG unremarkable for seizure activity on the 31st, LP completed at that time was unremarkable.  Patient also had notable bilateral pneumothorax bilateral chest tubes - now removed - CXR appears stable with resolving pneumothorax.  Assessment & Plan:   Active Problems:   Status epilepticus (HCC)   Primary spontaneous pneumothorax   Need for management of chest tube   Secondary spontaneous pneumothorax   Pneumothorax   Aspiration pneumonia of both lungs due to gastric secretions (HCC)  Acute metabolic/toxic encephalopathy in the setting of acute onset status epilepticus, resolved History of traumatic brain injury ?  Post concussive syndrome -Patient now awake alert oriented x3 -Patient's previously depressed mental status likely in the setting of seizure/toxic encephalopathy -UDS positive for opiates, benzos, THC -Neurology following, appreciate insight recommendations, currently recommending Depakote; unclear if patient to remain on Keppra as well -Lengthy discussion at bedside about need for lifestyle changes and seizure precautions including no driving, operating heavy machinery, heights -LP performed previously - unremarkable per report  Acute hypoxic respiratory failure, presumed aspiration pneumonia/pneumonitis Bilateral pneumothorax secondary to above - questionably trauamtic, resolved -On room air, will follow home oxygen screening later this afternoon -Patient's bilateral pneumothorax now resolved, status post chest tube removal -Chest x-ray this  morning appears quite stable no recurring or expanding pneumothorax -Aspiration pneumonia versus pneumonitis POA, has not been on antibiotics since admission other than initial dose in the ED -on antibiotics at this time given patient remains without leukocytosis or fever -Interestingly patient had bilateral pneumothorax without overt cause, barium swallow was unremarkable for leakage, no overt was noted on ventilator -to be traumatic given patient's fall from a height during seizure    DVT prophylaxis: Lovenox Code Status: Full Disposition Plan: Clinical improvement, likely disposition home in the next 24 hours pending as of p.o. seizure medication to ensure no recurrent epileptic/seizure-like activity.  Patient also require repeat chest x-ray in the morning to ensure no worsening thorax  Consultants:   Neurology  Procedures:  5/30 >> 6/2 ETT  5/30 >> 6/1 R a-line 5/30 >> 6/1 L IJ central line 5/30 bilateral chest tubes 6/3 Lt chest tube out 6/4 Rt chest tube out   Subjective: Patient currently denies chest pain, nausea, shortness of breath, diarrhea, constipation, chest pain, fevers, chills.  Objective: Vitals:   07/15/18 1100 07/15/18 1200 07/15/18 1300 07/15/18 1400  BP: (!) 101/52 113/64 (!) 95/45 109/72  Pulse: 73 83 64 86  Resp: (!) 35 (!) 31 (!) 37 10  Temp: 98.8 F (37.1 C)     TempSrc: Oral     SpO2: 99% 97% 99% 98%  Weight:      Height:        Intake/Output Summary (Last 24 hours) at 07/15/2018 1431 Last data filed at 07/15/2018 1400 Gross per 24 hour  Intake 708.53 ml  Output -  Net 708.53 ml   Filed Weights   07/13/18 0500 07/14/18 0500 07/15/18 0500  Weight: 71.5 kg 71.5 kg 69.3 kg    Examination:  General exam: Appears calm and comfortable  Respiratory system: Clear to auscultation. Respiratory effort normal. Cardiovascular system: S1 & S2 heard,  RRR. No JVD, murmurs, rubs, gallops or clicks. No pedal edema. Gastrointestinal system: Abdomen is  nondistended, soft and nontender. No organomegaly or masses felt. Normal bowel sounds heard. Central nervous system: Alert and oriented. No focal neurological deficits. Extremities: Symmetric 5 x 5 power. Skin: No rashes, lesions or ulcers Psychiatry: Judgement and insight appear normal. Mood & affect appropriate.     Data Reviewed: I have personally reviewed following labs and imaging studies  CBC: Recent Labs  Lab 07/11/18 0910 07/11/18 1141 07/12/18 0500 07/13/18 0151 07/14/18 0227 07/15/18 0222  WBC 8.0  --  4.0 5.2 7.4 8.2  HGB 12.6* 10.5* 11.1* 12.1* 12.9* 13.0  HCT 36.4* 31.0* 32.8* 35.2* 37.1* 37.7*  MCV 88.6  --  88.2 86.7 87.1 86.5  PLT 128*  --  126* 127* 194 209   Basic Metabolic Panel: Recent Labs  Lab 07/10/18 0404 07/11/18 0910 07/11/18 1141 07/11/18 1643 07/12/18 0500 07/12/18 1644 07/14/18 0227 07/15/18 0222  NA 141 137 138  --  137  --  137 139  K 4.5 3.8 3.5  --  3.0*  --  3.8 3.3*  CL 105 103  --   --  101  --  106 106  CO2 23 24  --   --  26  --  21* 22  GLUCOSE 113* 95  --   --  115*  --  94 95  BUN 13 15  --   --  14  --  13 13  CREATININE 1.42* 0.94  --   --  0.81  --  0.76 0.77  CALCIUM 7.9* 8.4*  --   --  8.4*  --  8.5* 8.7*  MG 2.0 2.1  --  1.8 1.7 1.7  --  1.7  PHOS 4.5 2.6  --  1.6* 1.8* 3.1  --  4.0   GFR: Estimated Creatinine Clearance: 144.4 mL/min (by C-G formula based on SCr of 0.77 mg/dL). Liver Function Tests: Recent Labs  Lab 07/09/18 0955  AST 31  ALT 23  ALKPHOS 60  BILITOT 0.6  PROT 6.6  ALBUMIN 4.1   No results for input(s): LIPASE, AMYLASE in the last 168 hours. Recent Labs  Lab 07/13/18 0151  AMMONIA 36*   Coagulation Profile: No results for input(s): INR, PROTIME in the last 168 hours. Cardiac Enzymes: Recent Labs  Lab 07/09/18 1424  TROPONINI 0.03*   BNP (last 3 results) No results for input(s): PROBNP in the last 8760 hours. HbA1C: No results for input(s): HGBA1C in the last 72 hours. CBG:  Recent Labs  Lab 07/11/18 1933 07/11/18 2312 07/12/18 0318 07/12/18 0736 07/12/18 1139  GLUCAP 90 111* 111* 133* 125*   Lipid Profile: No results for input(s): CHOL, HDL, LDLCALC, TRIG, CHOLHDL, LDLDIRECT in the last 72 hours. Thyroid Function Tests: No results for input(s): TSH, T4TOTAL, FREET4, T3FREE, THYROIDAB in the last 72 hours. Anemia Panel: No results for input(s): VITAMINB12, FOLATE, FERRITIN, TIBC, IRON, RETICCTPCT in the last 72 hours. Sepsis Labs: Recent Labs  Lab 07/09/18 1050 07/09/18 1424 07/09/18 1700 07/09/18 2139 07/10/18 0404 07/11/18 0324  PROCALCITON  --  0.38  --   --  14.63 8.21  LATICACIDVEN 10.5* 1.7 3.5* 3.6*  --   --     Recent Results (from the past 240 hour(s))  SARS Coronavirus 2 (CEPHEID - Performed in [facility] hospital lab), Hosp Order     Status: None   Collection Time: 07/09/18  9:29 AM  Result Value Ref Range Status  SARS Coronavirus 2 NEGATIVE NEGATIVE Final    Comment: (NOTE) If result is NEGATIVE SARS-CoV-2 target nucleic acids are NOT DETECTED. The SARS-CoV-2 RNA is generally detectable in upper and lower  respiratory specimens during the acute phase of infection. The lowest  concentration of SARS-CoV-2 viral copies this assay can detect is 250  copies / mL. A negative result does not preclude SARS-CoV-2 infection  and should not be used as the sole basis for treatment or other  patient management decisions.  A negative result may occur with  improper specimen collection / handling, submission of specimen other  than nasopharyngeal swab, presence of viral mutation(s) within the  areas targeted by this assay, and inadequate number of viral copies  (<250 copies / mL). A negative result must be combined with clinical  observations, patient history, and epidemiological information. If result is POSITIVE SARS-CoV-2 target nucleic acids are DETECTED. The SARS-CoV-2 RNA is generally detectable in upper and lower  respiratory  specimens dur ing the acute phase of infection.  Positive  results are indicative of active infection with SARS-CoV-2.  Clinical  correlation with patient history and other diagnostic information is  necessary to determine patient infection status.  Positive results do  not rule out bacterial infection or co-infection with other viruses. If result is PRESUMPTIVE POSTIVE SARS-CoV-2 nucleic acids MAY BE PRESENT.   A presumptive positive result was obtained on the submitted specimen  and confirmed on repeat testing.  While 2019 novel coronavirus  (SARS-CoV-2) nucleic acids may be present in the submitted sample  additional confirmatory testing may be necessary for epidemiological  and / or clinical management purposes  to differentiate between  SARS-CoV-2 and other Sarbecovirus currently known to infect humans.  If clinically indicated additional testing with an alternate test  methodology [phone]) is advised. The SARS-CoV-2 RNA is generally  detectable in upper and lower respiratory sp ecimens during the acute  phase of infection. The expected result is Negative. Fact Sheet for Patients:  BoilerBrush.com.cy Fact Sheet for Healthcare Providers: https://pope.com/ This test is not yet approved or cleared by the [place] FDA and has been authorized for detection and/or diagnosis of SARS-CoV-2 by FDA under an Emergency Use Authorization (EUA).  This EUA will remain in effect (meaning this test can be used) for the duration of the COVID-19 declaration under Section 564(b)(1) of the Act, 21 U.S.C. section 360bbb-3(b)(1), unless the authorization is terminated or revoked sooner. Performed at [facility], [address], [place], [place] [address]   Culture, blood (routine x 2)     Status: None   Collection Time: 07/09/18 12:50 PM  Result Value Ref Range Status   Specimen Description BLOOD CENTRAL LINE  Final   Special Requests    Final    BOTTLES DRAWN AEROBIC AND ANAEROBIC Blood Culture adequate volume   Culture   Final    NO GROWTH 5 DAYS Performed at [facility], [address] [place], [place] [address]    Report Status 07/14/2018 FINAL  Final  Culture, blood (routine x 2)     Status: None   Collection Time: 07/09/18  1:00 PM  Result Value Ref Range Status   Specimen Description BLOOD LEFT HAND  Final   Special Requests   Final    BOTTLES DRAWN AEROBIC AND ANAEROBIC Blood Culture adequate volume   Culture   Final    NO GROWTH 5 DAYS Performed at [facility], [address] [place], [place] [address]  Report Status 07/14/2018 FINAL  Final  Culture, respiratory (non-expectorated)     Status: None   Collection Time: 07/10/18  8:17 AM  Result Value Ref Range Status   Specimen Description TRACHEAL ASPIRATE  Final   Special Requests NONE  Final   Gram Stain   Final    RARE WBC PRESENT, PREDOMINANTLY PMN RARE GRAM POSITIVE COCCI IN CHAINS RARE GRAM NEGATIVE RODS    Culture   Final    FEW Consistent with normal respiratory flora. Performed at [facility], [address] [place], [place] [address]    Report Status 07/12/2018 FINAL  Final  MRSA PCR Screening     Status: None   Collection Time: 07/11/18  9:40 AM  Result Value Ref Range Status   MRSA by PCR NEGATIVE NEGATIVE Final    Comment:        The GeneXpert MRSA Assay (FDA approved for NASAL specimens only), is one component of a comprehensive MRSA colonization surveillance program. It is not intended to diagnose MRSA infection nor to guide or monitor treatment for MRSA infections. Performed at [facility], [address] [place], [place] [address]   CSF culture     Status: None   Collection Time: 07/11/18 10:15 AM  Result Value Ref Range Status   Specimen Description CSF  Final   Special Requests NONE  Final   Gram Stain   Final    CYTOSPIN SMEAR WBC PRESENT, PREDOMINANTLY MONONUCLEAR NO ORGANISMS SEEN     Culture   Final    NO GROWTH 3 DAYS Performed at [facility], [address] [place], [place] [address]    Report Status 07/14/2018 FINAL  Final         Radiology Studies: Mr Brain Wo Contrast  Result Date: 07/14/2018 CLINICAL DATA:  Seizure and encephalopathy EXAM: MRI HEAD WITHOUT CONTRAST TECHNIQUE: Multiplanar, multiecho pulse sequences of the brain and surrounding structures were obtained without intravenous contrast. COMPARISON:  Head CT 07/09/2018 FINDINGS: The examination is degraded by motion. Additionally, a complete exam could not be obtained. Five sequences are provided, including sagittal T1, axial diffusion and ADC, axial T2 and axial FLAIR. BRAIN: There is no acute infarct, acute hemorrhage or extra-axial collection. The midline structures are normal. No midline shift or other mass effect. The white matter signal is normal for the patient's age. The cerebral and cerebellar volume are age-appropriate. No hydrocephalus. Susceptibility-sensitive sequences show no chronic microhemorrhage or superficial siderosis. No mass lesion. VASCULAR: The major intracranial arterial and venous sinus flow voids are normal. SKULL AND UPPER CERVICAL SPINE: Calvarial bone marrow signal is normal. There is no skull base mass. Visualized upper cervical spine and soft tissues are normal. SINUSES/ORBITS: Moderate maxillary and sphenoid sinus mucosal thickening. Small amount of bilateral mastoid fluid. The orbits are normal. IMPRESSION: 1. Motion degraded and discontinued examination. 2. No acute intracranial abnormality demonstrated on the available sequences. 3. Moderate sphenoid and maxillary sinus mucosal disease. Electronically Signed   By: Deatra RobinsonKevin  Herman M.D.   On: 07/14/2018 22:56   Dg Chest Port 1 View  Result Date: 07/15/2018 CLINICAL DATA:  Acute respiratory failure. EXAM: PORTABLE CHEST 1 VIEW COMPARISON:  Radiograph July 14, 2018. FINDINGS: The heart size and mediastinal contours are  within normal limits. Both lungs are clear. No definite pneumothorax is noted. Stable subcutaneous emphysema is seen in right supraclavicular region. The visualized skeletal structures are unremarkable. IMPRESSION: No definite pneumothorax seen currently. Stable right supraclavicular subcutaneous emphysema. Electronically  Signed   By: Lupita Raider M.D.   On: 07/15/2018 07:33   Dg Chest Port 1 View  Result Date: 07/14/2018 CLINICAL DATA:  Acute respiratory failure. EXAM: PORTABLE CHEST 1 VIEW COMPARISON:  07/14/2018. FINDINGS: Normal sized heart. Clear lungs. Very tiny residual right pneumothorax. Bilateral neck subcutaneous emphysema is less prominent. Unremarkable bones. IMPRESSION: Very tiny residual right pneumothorax with improved bilateral neck subcutaneous emphysema. No acute abnormality. Electronically Signed   By: Beckie Salts M.D.   On: 07/14/2018 20:47   Dg Chest Port 1 View  Result Date: 07/14/2018 CLINICAL DATA:  Status post chest tube EXAM: PORTABLE CHEST 1 VIEW COMPARISON:  07/14/2018 FINDINGS: Right-sided chest tube has been removed in the interval. A small right-sided pneumothorax is noted. No focal infiltrate is seen. No bony abnormality is noted. IMPRESSION: Small right-sided pneumothorax in the apex following chest tube removal. Electronically Signed   By: Alcide Clever M.D.   On: 07/14/2018 14:31   Dg Chest Port 1 View  Result Date: 07/14/2018 CLINICAL DATA:  Follow-up pneumothorax EXAM: PORTABLE CHEST 1 VIEW COMPARISON:  07/13/2018 FINDINGS: Right-sided chest tube is again identified and stable. Cardiac shadow is within normal limits. The lungs are well aerated bilaterally. No definitive pneumothorax is seen. Improved aeration in the left base is noted. No new focal abnormality is noted. IMPRESSION: No pneumothorax identified on the right. Improved aeration in the left base is noted. Electronically Signed   By: Alcide Clever M.D.   On: 07/14/2018 07:15   Dg Chest Port 1 View   Result Date: 07/13/2018 CLINICAL DATA:  Pneumothorax EXAM: PORTABLE CHEST 1 VIEW COMPARISON:  07/13/2018 FINDINGS: The left-sided chest tube has been removed. There may be a trace left apical pneumothorax. There is persistent airspace consolidation at the left lung base. The right-sided chest tube remains in place. No evidence of a right-sided pneumothorax. There is a curvilinear lucency medially that is favored to represent artifact. Subcutaneous gas is noted at the low neck. IMPRESSION: 1. Status post removal of the left-sided chest tube with a trace left apical pneumothorax. 2. Right-sided chest tube in place. Linear lucency medially within the right hemithorax is favored to represent artifact as opposed to a pneumothorax. Electronically Signed   By: Katherine Mantle M.D.   On: 07/13/2018 20:29   Dg Esophagus W Single Cm (sol Or Thin Ba)  Result Date: 07/14/2018 CLINICAL DATA:  Pneumomediastinum EXAM: ESOPHOGRAM/BARIUM SWALLOW TECHNIQUE: Single contrast examination was performed using water-soluble contrast. FLUOROSCOPY TIME:  Fluoroscopy Time:  48 seconds Radiation Exposure Index (if provided by the fluoroscopic device): Number of Acquired Spot Images: 0 COMPARISON:  CT 07/09/2018 FINDINGS: Fluoroscopic evaluation of swallowing with Omnipaque 300 IV shows normal esophageal peristalsis. No extravasation/leak. No evidence of esophageal obstruction. IMPRESSION: Normal study.  No evidence of esophageal leak. Electronically Signed   By: Charlett Nose M.D.   On: 07/14/2018 12:53        Scheduled Meds: . Chlorhexidine Gluconate Cloth  6 each Topical Daily  . cloNIDine  0.2 mg Transdermal Weekly  . enoxaparin (LOVENOX) injection  40 mg Subcutaneous Q24H  . [START ON 07/16/2018] folic acid  1 mg Oral Daily  . levETIRAcetam  1,000 mg Oral BID  . mouth rinse  15 mL Mouth Rinse BID  . nicotine  21 mg Transdermal Daily  . [START ON 07/16/2018] thiamine  100 mg Oral Daily  . valproic acid  500 mg Oral TID    Continuous Infusions: . sodium chloride Stopped (07/13/18 0129)  LOS: 6 days   Time spent: 69 Min   Azucena Fallen, DO [facility] Pager [phone]  If 7PM-7AM, please contact night-coverage [email or web address] Password [facility] 07/15/2018, 2:31 PM

## 2018-07-16 ENCOUNTER — Inpatient Hospital Stay (HOSPITAL_COMMUNITY): Payer: Medicaid Other

## 2018-07-16 DIAGNOSIS — S270XXA Traumatic pneumothorax, initial encounter: Secondary | ICD-10-CM

## 2018-07-16 LAB — CBC
HCT: 40 % (ref 39.0–52.0)
Hemoglobin: 13.9 g/dL (ref 13.0–17.0)
MCH: 30.3 pg (ref 26.0–34.0)
MCHC: 34.8 g/dL (ref 30.0–36.0)
MCV: 87.1 fL (ref 80.0–100.0)
Platelets: 253 10*3/uL (ref 150–400)
RBC: 4.59 MIL/uL (ref 4.22–5.81)
RDW: 13.3 % (ref 11.5–15.5)
WBC: 6.7 10*3/uL (ref 4.0–10.5)
nRBC: 0 % (ref 0.0–0.2)

## 2018-07-16 LAB — BASIC METABOLIC PANEL
Anion gap: 11 (ref 5–15)
BUN: 19 mg/dL (ref 6–20)
CO2: 25 mmol/L (ref 22–32)
Calcium: 8.7 mg/dL — ABNORMAL LOW (ref 8.9–10.3)
Chloride: 103 mmol/L (ref 98–111)
Creatinine, Ser: 0.87 mg/dL (ref 0.61–1.24)
GFR calc Af Amer: >60 mL/min (ref >60)
GFR calc non Af Amer: >60 mL/min (ref >60)
Glucose, Bld: 89 mg/dL (ref 70–99)
Potassium: 3.2 mmol/L — ABNORMAL LOW (ref 3.5–5.1)
Sodium: 139 mmol/L (ref 135–145)

## 2018-07-16 MED ORDER — MULTI-VITAMIN/MINERALS PO TABS: 1.0000 | ORAL_TABLET | Freq: Every day | ORAL | 0 refills | Status: AC

## 2018-07-16 MED ORDER — VALPROIC ACID 250 MG PO CAPS: 500.0000 mg | ORAL_CAPSULE | Freq: Three times a day (TID) | ORAL | 0 refills | Status: AC

## 2018-07-16 MED ORDER — LEVETIRACETAM 1000 MG PO TABS: 1000.0000 mg | ORAL_TABLET | Freq: Two times a day (BID) | ORAL | 0 refills | Status: AC

## 2018-07-16 NOTE — Discharge Summary (Signed)
Physician Discharge Summary  TALMAGE TEASTER ZOX:[id number] DOB: 09-18-1995 DOA: 07/09/2018  PCP: Mariel Sleet, PA-C  Admit date: 07/09/2018 Discharge date: 07/16/2018  Admitted From: Home  Disposition: Home   Recommendations for Outpatient Follow-up:  1. Follow up with PCP, neurology in 1-2 weeks as scheduled 2. Please obtain BMP/CBC in one week  Discharge Condition: Stable CODE STATUS: Full  Brief/Interim Summary: 23 year old male with no known medical history presented to the ER via EMS after witnessed seizure-like activity and fall from height.No known seizure history.Required intubation in route however patient self extubated and required reintubation in the emergency room. Patient remained intubated from 07/09/2018 to 07/12/2018, EEG unremarkable for seizure activity on the 31st, LP completed at that time was unremarkable per report.  Patient also had notable bilateral pneumothorax bilateral chest tubes - now removed - CXR appears stable with resolving pneumothorax.  As above for acute metabolic encephalopathy in the setting of likely new onset seizure, complex history with questionable TBI and seizure history previously but this is unconfirmed, patient extubated previously on 07/12/2018, episode of likely ICU delirium that afternoon after extubation - resolved quite quickly.  Patient awake alert oriented as of 07/15/2018 essentially back to baseline.  Patient continues to be followed by neurology recommending antiepileptic medications as below, otherwise resolved from bilateral pneumothorax, chest tube was previously removed, chest x-ray repeat today stable with no worsening pneumothorax, remains without hypoxia or chest pain, dyspnea with exertion.  Patient is otherwise stable and agreeable for discharge home, discussed at length at bedside need for medication compliance, limitation in lifestyle including cessation of driving, avoiding heights/heavy machinery operation as per neurology  note.  Patient otherwise will follow with PCP and neurology in the outpatient setting as scheduled, stable and agreeable for discharge.  Discharge Diagnoses:  Active Problems:   Status epilepticus (HCC)   Primary spontaneous pneumothorax   Need for management of chest tube   Secondary spontaneous pneumothorax   Pneumothorax   Aspiration pneumonia of both lungs due to gastric secretions (HCC)  Acute metabolic/toxic encephalopathy in the setting of acute onset status epilepticus, resolved History of traumatic brain injury ?  Post concussive syndrome -Patient now awake alert oriented x3 -Patient's previously depressed mental status likely in the setting of seizure/toxic encephalopathy -UDS positive for opiates, benzos, THC -Neurology following, appreciate insight recommendations -Lengthy discussion at bedside about need for lifestyle changes and seizure precautions including no driving, operating heavy machinery, heights -LP performed previously - unremarkable per report  Acute hypoxic respiratory failure, presumed aspiration pneumonia/pneumonitis Bilateral pneumothorax secondary to above - questionably trauamtic, resolved -On room air, satting well even with exertion -Patient's bilateral pneumothorax now resolved, status post chest tube removal -Chest x-ray this morning appears quite stable no recurring or expanding pneumothorax -Aspiration pneumonia versus pneumonitis POA, has not been on antibiotics since admission other than initial dose in the ED -on antibiotics at this time given patient remains without leukocytosis or fever -Interestingly patient had bilateral pneumothorax without overt cause, barium swallow was unremarkable for leakage, no overt leak was noted on ventilator - possibly traumatic given patient's fall from a height during seizure  Discharge Instructions  Discharge Instructions    CSF culture   Complete by:  As directed      Allergies as of 07/16/2018       Reactions   Penicillins Other (See Comments)   Pt does not know reaction      Medication List    TAKE these medications   acetaminophen 500 MG  tablet Commonly known as:  TYLENOL Take 1,000 mg by mouth every 6 (six) hours as needed for mild pain.   HYDROcodone-acetaminophen 5-325 MG tablet Commonly known as:  NORCO/VICODIN Take 1 tablet by mouth every 6 (six) hours as needed for moderate pain.   levETIRAcetam 1000 MG tablet Commonly known as:  KEPPRA Take 1 tablet (1,000 mg total) by mouth 2 (two) times daily for 30 days.   multivitamin with minerals tablet Take 1 tablet by mouth daily for 30 days.   valproic acid 250 MG capsule Commonly known as:  DEPAKENE Take 2 capsules (500 mg total) by mouth 3 (three) times daily for 30 days.       Allergies  Allergen Reactions  . Penicillins Other (See Comments)    Pt does not know reaction     Consultations: Neurology - [facility]  Procedures/Studies: Ct Head Wo Contrast  Result Date: 07/09/2018 CLINICAL DATA:  Seizure activity, new EXAM: CT HEAD WITHOUT CONTRAST TECHNIQUE: Contiguous axial images were obtained from the base of the skull through the vertex without intravenous contrast. COMPARISON:  None. FINDINGS: Brain: No evidence of acute infarction, hemorrhage, hydrocephalus, extra-axial collection or mass lesion/mass effect. Vascular: No hyperdense vessel or unexpected calcification. Skull: Normal. Negative for fracture or focal lesion. Sinuses/Orbits: The visualized paranasal sinuses are essentially clear. The mastoid air cells are unopacified. Other: None. IMPRESSION: Normal head CT. Electronically Signed   By: Charline BillsSriyesh  Krishnan M.D.   On: 07/09/2018 10:30   Ct Angio Chest Pe W Or Wo Contrast  Result Date: 07/09/2018 CLINICAL DATA:  Severe hypoxia patient today. Patient status post seizure. EXAM: CT ANGIOGRAPHY CHEST WITH CONTRAST TECHNIQUE: Multidetector CT imaging of the chest was performed using the standard  protocol during bolus administration of intravenous contrast. Multiplanar CT image reconstructions and MIPs were obtained to evaluate the vascular anatomy. CONTRAST:  60 mL OMNIPAQUE IOHEXOL 350 MG/ML SOLN COMPARISON:  Plain films of the chest earlier today. FINDINGS: Cardiovascular: Satisfactory opacification of the pulmonary arteries to the segmental level. No evidence of pulmonary embolism. Normal heart size. No pericardial effusion. Mediastinum/Nodes: There is extensive pneumomediastinum with gas tracking into the soft tissues of the neck and along the rectus abdominus. Endotracheal tube is in place with the tip in good position well above the carina. NG tube courses into the stomach and below the inferior margin of the scan. No lymphadenopathy. Lungs/Pleura: The patient has small bilateral pneumothoraces, larger on the left where it is estimated at approximately 40%. Dense opacity is present in the lower lobes bilaterally, worse on the left. Patchy airspace disease is also present in the left upper lobe. Upper Abdomen: Imaged abdominal viscera is unremarkable. NG tube tip is in the stomach. Musculoskeletal: No acute or focal bony abnormality is seen. Review of the MIP images confirms the above findings. IMPRESSION: Bilateral pneumothoraces, larger on the left were pneumothorax it is estimated at 40%. Right pneumothorax is very small. Dense bilateral lower lobe opacities and patchy airspace disease in the left upper lobe. Airspace disease is likely due to aspiration given the patient's history. There is also a component of collapse in the lower lobes. Pneumonia is possible less. Extensive pneumomediastinum. Cause for this is not identified. Question barotrauma. Critical Value/emergent results were called by telephone at the time of interpretation on 07/09/2018 at 2:34 pm to [facility]Y PARRETT, N.P., who verbally acknowledged these results. Electronically Signed   By: Drusilla Kannerhomas  Dalessio M.D.   On: 07/09/2018 14:39   Mr  Brain Wo Contrast  Result  Date: 07/14/2018 CLINICAL DATA:  Seizure and encephalopathy EXAM: MRI HEAD WITHOUT CONTRAST TECHNIQUE: Multiplanar, multiecho pulse sequences of the brain and surrounding structures were obtained without intravenous contrast. COMPARISON:  Head CT 07/09/2018 FINDINGS: The examination is degraded by motion. Additionally, a complete exam could not be obtained. Five sequences are provided, including sagittal T1, axial diffusion and ADC, axial T2 and axial FLAIR. BRAIN: There is no acute infarct, acute hemorrhage or extra-axial collection. The midline structures are normal. No midline shift or other mass effect. The white matter signal is normal for the patient's age. The cerebral and cerebellar volume are age-appropriate. No hydrocephalus. Susceptibility-sensitive sequences show no chronic microhemorrhage or superficial siderosis. No mass lesion. VASCULAR: The major intracranial arterial and venous sinus flow voids are normal. SKULL AND UPPER CERVICAL SPINE: Calvarial bone marrow signal is normal. There is no skull base mass. Visualized upper cervical spine and soft tissues are normal. SINUSES/ORBITS: Moderate maxillary and sphenoid sinus mucosal thickening. Small amount of bilateral mastoid fluid. The orbits are normal. IMPRESSION: 1. Motion degraded and discontinued examination. 2. No acute intracranial abnormality demonstrated on the available sequences. 3. Moderate sphenoid and maxillary sinus mucosal disease. Electronically Signed   By: Deatra Robinson M.D.   On: 07/14/2018 22:56   US Scrotum  Result Date: 07/11/2018 CLINICAL DATA:  Scrotal swelling for 12 hours.  Pneumonia. EXAM: ULTRASOUND OF SCROTUM TECHNIQUE: Complete ultrasound examination of the testicles, epididymis, and other scrotal structures was performed. COMPARISON:  Portable chest same date.  One view abdomen 07/09/2018. FINDINGS: Right testicle Measurements: 4.5 x 2.0 x 3.3 cm. No mass or microlithiasis visualized. Normal  blood flow with color Doppler. Left testicle Measurements: 4.5 x 2.1 x 3.2 cm. No mass or microlithiasis visualized. Normal blood flow with color Doppler. Right epididymis:  Not well visualized.  No abnormality identified. Left epididymis:  Normal in size and appearance. Hydrocele: Small left-sided hydrocele. There is echogenic shadowing material within the scrotum consistent with air. Varicocele:  None visualized. IMPRESSION: 1. Echogenic shadowing material within the scrotum most consistent with scrotal emphysema. This is likely related to barotrauma in this patient with bilateral pneumothoraces and soft tissue emphysema in the chest. Infection should be excluded clinically. 2. Small left-sided hydrocele. 3. Both testes appear normal. Electronically Signed   By: Carey Bullocks M.D.   On: 07/11/2018 13:19   Dg Pelvis Portable  Result Date: 07/11/2018 CLINICAL DATA:  Known significant subcutaneous emphysema with possible air within the scrotum. EXAM: PORTABLE PELVIS 1-2 VIEWS COMPARISON:  None. FINDINGS: Pelvic ring appears intact. No fractures are seen. There is air noted within both has of the scrotum as well as within the pelvis and subcutaneous tissues likely related to spread from the previously seen pneumothoraces and subcutaneous emphysema. IMPRESSION: Changes consistent with air in the scrotum as well as interdigitating air surrounding the bladder deep within the pelvis as well as the lateral abdominal wall on the right. This is consistent with spread of previously seen subcutaneous emphysema inferiorly. Critical Value/emergent results were called by telephone at the time of interpretation on 07/11/2018 at 3:41 pm to Dr. Levon Hedger , who verbally acknowledged these results. Electronically Signed   By: Alcide Clever M.D.   On: 07/11/2018 15:41   Dg Chest Port 1 View  Result Date: 07/16/2018 CLINICAL DATA:  Recent pneumothorax EXAM: PORTABLE CHEST 1 VIEW COMPARISON:  July 14, 2018 and July 15, 2018  FINDINGS: Trace apicolateral pneumothorax on the right is stable. No tension component. There is subcutaneous air in  the supraclavicular regions bilaterally, stable. No edema or consolidation. Heart size and pulmonary vascular normal. No adenopathy. No bone lesions. IMPRESSION: Trace right-sided apicolateral pneumothorax. No tension component. Persistent supraclavicular subcutaneous air. No edema or consolidation. Stable cardiac silhouette. Electronically Signed   By: Bretta Bang III M.D.   On: 07/16/2018 09:29   Dg Chest Port 1 View  Result Date: 07/15/2018 CLINICAL DATA:  Acute respiratory failure. EXAM: PORTABLE CHEST 1 VIEW COMPARISON:  Radiograph July 14, 2018. FINDINGS: The heart size and mediastinal contours are within normal limits. Both lungs are clear. No definite pneumothorax is noted. Stable subcutaneous emphysema is seen in right supraclavicular region. The visualized skeletal structures are unremarkable. IMPRESSION: No definite pneumothorax seen currently. Stable right supraclavicular subcutaneous emphysema. Electronically Signed   By: Lupita Raider M.D.   On: 07/15/2018 07:33   Dg Chest Port 1 View  Result Date: 07/14/2018 CLINICAL DATA:  Acute respiratory failure. EXAM: PORTABLE CHEST 1 VIEW COMPARISON:  07/14/2018. FINDINGS: Normal sized heart. Clear lungs. Very tiny residual right pneumothorax. Bilateral neck subcutaneous emphysema is less prominent. Unremarkable bones. IMPRESSION: Very tiny residual right pneumothorax with improved bilateral neck subcutaneous emphysema. No acute abnormality. Electronically Signed   By: Beckie Salts M.D.   On: 07/14/2018 20:47   Dg Chest Port 1 View  Result Date: 07/14/2018 CLINICAL DATA:  Status post chest tube EXAM: PORTABLE CHEST 1 VIEW COMPARISON:  07/14/2018 FINDINGS: Right-sided chest tube has been removed in the interval. A small right-sided pneumothorax is noted. No focal infiltrate is seen. No bony abnormality is noted. IMPRESSION: Small  right-sided pneumothorax in the apex following chest tube removal. Electronically Signed   By: Alcide Clever M.D.   On: 07/14/2018 14:31   Dg Chest Port 1 View  Result Date: 07/14/2018 CLINICAL DATA:  Follow-up pneumothorax EXAM: PORTABLE CHEST 1 VIEW COMPARISON:  07/13/2018 FINDINGS: Right-sided chest tube is again identified and stable. Cardiac shadow is within normal limits. The lungs are well aerated bilaterally. No definitive pneumothorax is seen. Improved aeration in the left base is noted. No new focal abnormality is noted. IMPRESSION: No pneumothorax identified on the right. Improved aeration in the left base is noted. Electronically Signed   By: Alcide Clever M.D.   On: 07/14/2018 07:15   Dg Chest Port 1 View  Result Date: 07/13/2018 CLINICAL DATA:  Pneumothorax EXAM: PORTABLE CHEST 1 VIEW COMPARISON:  07/13/2018 FINDINGS: The left-sided chest tube has been removed. There may be a trace left apical pneumothorax. There is persistent airspace consolidation at the left lung base. The right-sided chest tube remains in place. No evidence of a right-sided pneumothorax. There is a curvilinear lucency medially that is favored to represent artifact. Subcutaneous gas is noted at the low neck. IMPRESSION: 1. Status post removal of the left-sided chest tube with a trace left apical pneumothorax. 2. Right-sided chest tube in place. Linear lucency medially within the right hemithorax is favored to represent artifact as opposed to a pneumothorax. Electronically Signed   By: Katherine Mantle M.D.   On: 07/13/2018 20:29   Dg Chest Port 1 View  Result Date: 07/13/2018 CLINICAL DATA:  Follow-up pneumothorax EXAM: PORTABLE CHEST 1 VIEW COMPARISON:  07/13/2018 FINDINGS: Cardiac shadow is within normal limits. Bilateral chest tubes are again seen and stable. No pneumothorax is identified. Improved aeration in the left base is noted although some mild persistent density remains. No bony abnormality is seen. IMPRESSION:  No pneumothorax. Improved aeration in the left base. Electronically Signed   By:  Alcide CleverMark  Lukens M.D.   On: 07/13/2018 14:02   Dg Chest Port 1 View  Result Date: 07/13/2018 CLINICAL DATA:  Follow-up chest tubes EXAM: PORTABLE CHEST 1 VIEW COMPARISON:  07/12/18 FINDINGS: Cardiac shadow is stable. Bibasilar chest tubes are again seen without evidence of pneumothorax minimal mediastinal air is again noted and stable. Slight increase in parenchymal opacity is noted bilaterally particularly in the left base. No bony abnormality is noted. IMPRESSION: No pneumothorax seen. Increasing parenchymal opacity within the lung bases particularly on the left. Electronically Signed   By: Alcide CleverMark  Lukens M.D.   On: 07/13/2018 07:12   Dg Chest Port 1 View  Result Date: 07/12/2018 CLINICAL DATA:  Bilateral chest tubes.  Shortness of breath. EXAM: PORTABLE CHEST 1 VIEW COMPARISON:  07/12/2018 FINDINGS: Central venous catheter, endotracheal tube and orogastric tube have been removed. A right-sided chest tube tip projects over the mid right hemidiaphragm and the tip of the left-sided chest tube projects over the medial inferior heart border. There is persistent mild pneumomediastinum with extensive subcutaneous emphysema of the neck. There is air outlining the right horizontal fissure, likely indicating a small pneumothorax. There is no focal airspace consolidation. Mild left basilar opacities have slightly improved. IMPRESSION: Bilateral chest tubes, projecting over the mid diaphragm on the right and over the lower mediastinum on the left. Persistent pneumomediastinum and suspected small right pneumothorax. Electronically Signed   By: Deatra RobinsonKevin  Herman M.D.   On: 07/12/2018 19:43   Dg Chest Port 1 View  Result Date: 07/12/2018 CLINICAL DATA:  Follow-up barotrauma and bilateral pneumothoraces EXAM: PORTABLE CHEST 1 VIEW COMPARISON:  07/11/2018 FINDINGS: Cardiac shadow is stable. Endotracheal tube, left jugular central line and gastric  catheter are noted in satisfactory position. Bilateral thoracostomy tubes are seen without evidence of pneumothorax. Bibasilar opacities are noted left greater than right new from the prior exam. Diffuse subcutaneous emphysema is again noted bilaterally. IMPRESSION: No evidence of pneumothoraces. Bibasilar opacities left greater than right likely representing atelectasis. Electronically Signed   By: Alcide CleverMark  Lukens M.D.   On: 07/12/2018 07:44   Dg Chest Port 1 View  Result Date: 07/11/2018 CLINICAL DATA:  Follow-up bilateral pneumothoraces EXAM: PORTABLE CHEST 1 VIEW COMPARISON:  07/10/2018 FINDINGS: Cardiac shadow is within normal limits. Endotracheal tube and gastric catheter are noted in satisfactory position. Bilateral chest tubes are again seen and stable. Previously seen pneumothoraces are no longer identified. Subcutaneous emphysema is noted bilaterally. Left jugular central line is noted in the distal superior vena cava. No bony abnormality is noted. IMPRESSION: Resolution of previously seen pneumothoraces. Bilateral chest tubes as described. No other focal abnormality is noted. Electronically Signed   By: Alcide CleverMark  Lukens M.D.   On: 07/11/2018 09:36   Dg Chest Port 1 View  Result Date: 07/10/2018 CLINICAL DATA:  Increase left pneumothorax today in a patient with a left chest tube in place. EXAM: PORTABLE CHEST 1 VIEW COMPARISON:  Single-view of the chest at 7:52 a.m. today. FINDINGS: Support tubes and lines including bilateral chest tubes are unchanged. The left pneumothorax seen on the prior examination has markedly improved and is now estimated at 10%. No right pneumothorax. Airspace disease in the left mid chest is unchanged. IMPRESSION: Decreased left pneumothorax now estimated at 10%. No new abnormality or other change. Electronically Signed   By: Drusilla Kannerhomas  Dalessio M.D.   On: 07/10/2018 11:37   Dg Chest Port 1 View  Result Date: 07/10/2018 CLINICAL DATA:  Respiratory failure EXAM: PORTABLE CHEST 1  VIEW COMPARISON:  Chest  radiograph from one day prior. FINDINGS: Endotracheal tube tip is 4.2 cm above the carina. Enteric tube enters stomach with the tip not seen on this image. Left internal jugular central venous catheter terminates at the cavoatrial junction. Bibasilar chest tubes are in place. Stable cardiomediastinal silhouette with normal heart size. No appreciable right pneumothorax. Large left pneumothorax has significantly increased, now approximately 30%. No pleural effusion. Patchy parahilar opacities in the left greater than right lungs are similar. Subcutaneous emphysema in the bilateral lower neck again noted. IMPRESSION: 1. Large left pneumothorax, approximately 30%, significantly increased. 2. No appreciable residual right pneumothorax. 3. Similar patchy parahilar lung opacities, left greater than right. Critical Value/emergent results were called by telephone at the time of interpretation on 07/10/2018 at 9:11 am to Dr. Rubye Oaks , who verbally acknowledged these results. Electronically Signed   By: Delbert Phenix M.D.   On: 07/10/2018 09:19   Dg Chest Port 1 View  Result Date: 07/10/2018 CLINICAL DATA:  Follow-up right pneumothorax. EXAM: PORTABLE CHEST 1 VIEW COMPARISON:  07/09/2018 FINDINGS: Endotracheal tube, left jugular central venous catheter, orogastric tube, and bilateral chest tubes remain in appropriate position. Small left basilar pneumothorax has decreased in size since previous study. Decreased airspace disease in left lower lung. Decreased size of pneumomediastinum also noted. A small right pneumothorax appears mildly increased in size since prior study. Decreased airspace opacity seen in the medial right lung base. No new or increased areas of pulmonary opacity are identified. Heart size is normal. IMPRESSION: 1. Mild increase in size of small right pneumothorax, with chest tube remaining in place. 2. Decreased left basilar pneumothorax and pneumomediastinum. 3. Decreased  airspace opacity in both lower lung zones. Electronically Signed   By: Myles Rosenthal M.D.   On: 07/10/2018 00:24   Dg Chest Port 1 View  Result Date: 07/09/2018 CLINICAL DATA:  Pneumothorax EXAM: PORTABLE CHEST 1 VIEW COMPARISON:  Radiograph 07/09/2018 at 1518 hours, CT 07/09/2018 FINDINGS: Persistent LEFT pneumothorax measuring 3.2 cm from the lateral chest wall unchanged . Chest tube positioned at the LEFT lung base directed downward. No change. Dense consolidation in the LEFT lower lobe. Chest tube at the RIGHT lung base. No evidence of RIGHT pneumothorax. The LEFT hemithorax volume appears greater than the RIGHT and potential shift of the heart towards the RIGHT. Endotracheal tube and NG tube unchanged. Central venous line unchanged. Small pneumomediastinum again noted. Gas in the posterior triangles of the neck. IMPRESSION: 1. Persistent large LEFT pneumothorax with chest tube at the LEFT lung base. The LEFT hemithorax volume appears larger than the RIGHT which could indicate tension pneumothorax. 2. RIGHT chest tube in place without pneumothorax. 3. Persistent pneumomediastinum and subcutaneous gas in the neck. 4. Endotracheal tube in good position. Electronically Signed   By: Genevive Bi M.D.   On: 07/09/2018 18:40   Dg Chest Port 1 View  Result Date: 07/09/2018 CLINICAL DATA:  Right chest tube placement EXAM: PORTABLE CHEST 1 VIEW COMPARISON:  Chest x-ray Jul 09, 2018 FINDINGS: A left-sided chest tube remains in place, stable. The left-sided pneumothorax is larger in the interval at the base measuring up to 3.3 cm. Only trace pneumothorax was seen previously. The left-sided pneumothorax does extend into the apex measuring approximately 3.3 cm as well. Increasing density in the left lung is likely due to decreased aeration. A right sided chest tube has been placed in the interval. The right-sided pneumothorax is no longer seen. Mediastinal air remains, also seen on the CT scan from earlier today.  The ETT is stable. The NG tube side port is just below the GE junction. Stable left central line. Transcutaneous pacer leads overlie the lower left chest. No change in the cardiomediastinal silhouette. No other acute abnormalities. Continued air in the subcutaneous tissues at the base of the neck bilaterally. IMPRESSION: 1. The left sided pneumothorax has enlarged despite the left-sided chest tube which is stable. The left-sided pneumothorax now measures up to 3.3 cm in thickness at the base. 2. Right-sided chest tube has been placed. No right-sided pneumothorax seen. 3. Continued mediastinal air. 4. Other support apparatus is in good position. 5. Increasing density in the left lung may be due to decreasing aeration. 6. No other acute abnormalities. Findings called to the patient's nurse, PK. Electronically Signed   By: Gerome Sam III M.D   On: 07/09/2018 15:41   Dg Chest Port 1 View  Result Date: 07/09/2018 CLINICAL DATA:  Chest tube placement EXAM: PORTABLE CHEST 1 VIEW COMPARISON:  Chest x-ray from earlier today at 1243 FINDINGS: Left-sided chest tube has been placed and follows the diaphragm with tip present inferiorly. A left pneumothorax is trace at the apex. Right pneumothorax is enlarged since prior chest x-ray, with pleural line seen laterally at the base. There is likely also a sizable anterior component given asymmetric lucency. Quantification is difficult due to positioning, potentially 25% or larger. Bilateral lower lobe opacification with worse collapse on the right. Endotracheal tube tip between the clavicular heads and carina. Left IJ line with tip at the upper cavoatrial junction. The orogastric tube reaches the stomach. Pneumomediastinum. Contrast is still being excreted from the kidneys. These results were called by telephone at the time of interpretation on 07/09/2018 at 3:24 pm to PA Tammy , who verbally acknowledged these results. IMPRESSION: 1. Left chest tube at the base.  Left  pneumothorax is trace. 2. Enlarging, moderate right pneumothorax. 3. Lower lobe collapse/consolidation. Electronically Signed   By: Marnee Spring M.D.   On: 07/09/2018 15:24   Dg Chest Portable 1 View  Result Date: 07/09/2018 CLINICAL DATA:  Status post central line placement today. EXAM: PORTABLE CHEST 1 VIEW COMPARISON:  Single-view of the chest 11:35 a.m. this morning. FINDINGS: New left IJ approach central venous catheter is in place with its tip just above the superior cavoatrial junction. ETT, NG tube and defibrillator pads are also identified. Right perihilar airspace disease and airspace opacities in the left mid and lower lung zones persist. The patient has a small left pneumothorax, less than 5%. Extensive gas is present in the mediastinum and the soft tissues of the neck and has markedly increased since the study earlier today. Cause for this abnormality is not identified. IMPRESSION: Left IJ central venous catheter tip projects in the lower superior vena cava. Patient has a small left pneumothorax. Extensive pneumomediastinum has markedly worsened since the study approximately 2 hours ago. No change in left much worse than right airspace disease. Critical Value/emergent results were called by telephone at the time of interpretation on 07/09/2018 at 1:33 pm to Dr. Rhunette Croft, who verbally acknowledged these results. Electronically Signed   By: Drusilla Kanner M.D.   On: 07/09/2018 13:38   Dg Chest Portable 1 View  Result Date: 07/09/2018 CLINICAL DATA:  Respiratory difficulty EXAM: PORTABLE CHEST 1 VIEW COMPARISON:  Today 924 hours FINDINGS: NG tube tip is now beyond the gastroesophageal junction. Endotracheal tube is stable. There is now linear lucency along the right side of the mediastinum extending towards the right supraclavicular region worrisome  for pneumomediastinum. There is subcutaneous emphysema in the right side of the neck. No definite pneumothorax can be visualized. Airspace disease  throughout the left lung is worse. Central right airspace disease has developed. IMPRESSION: There is now emphysema in the right neck as well as findings suggesting pneumomediastinum. No definite pneumothorax is identified. Endotracheal tube is stable. NG tube tip is now in the stomach Worsening airspace disease throughout the left lung. New central right lung airspace disease. Electronically Signed   By: Marybelle Killings M.D.   On: 07/09/2018 11:47   Dg Chest Port 1 View  Result Date: 07/09/2018 CLINICAL DATA:  Endotracheal tube insertion EXAM: PORTABLE CHEST 1 VIEW COMPARISON:  None. FINDINGS: Endotracheal tube tip is 4.2 cm from the carina. The NG tube is coiled in the proximal esophagus. Hazy airspace disease at the left lung base. Right lung is clear. Normal heart size. No pneumothorax. IMPRESSION: Endotracheal tube tip is 4.2 cm from the carina. NG tube is coiled in the upper esophagus. Hazy left basilar airspace disease. Electronically Signed   By: Marybelle Killings M.D.   On: 07/09/2018 10:03   Dg Abd Portable 1 View  Result Date: 07/09/2018 CLINICAL DATA:  NG tube EXAM: PORTABLE ABDOMEN - 1 VIEW COMPARISON:  None. FINDINGS: NG tube tip is in the body of the stomach. There are no disproportionally dilated loops of bowel. There is no obvious free intraperitoneal gas. IMPRESSION: NG tube tip is in the body of the stomach. Electronically Signed   By: Marybelle Killings M.D.   On: 07/09/2018 10:08   Dg Esophagus W Single Cm (sol Or Thin Ba)  Result Date: 07/14/2018 CLINICAL DATA:  Pneumomediastinum EXAM: ESOPHOGRAM/BARIUM SWALLOW TECHNIQUE: Single contrast examination was performed using water-soluble contrast. FLUOROSCOPY TIME:  Fluoroscopy Time:  48 seconds Radiation Exposure Index (if provided by the fluoroscopic device): Number of Acquired Spot Images: 0 COMPARISON:  CT 07/09/2018 FINDINGS: Fluoroscopic evaluation of swallowing with Omnipaque 300 IV shows normal esophageal peristalsis. No extravasation/leak. No  evidence of esophageal obstruction. IMPRESSION: Normal study.  No evidence of esophageal leak. Electronically Signed   By: Rolm Baptise M.D.   On: 07/14/2018 12:53    Subjective: No acute issues or events overnight, feels quite well, requesting discharge which is certainly reasonable given his resolution of hypoxia, and mental status, no longer having any seizures and improved p.o. intake and mental status.  She otherwise declines chest pain, shortness of breath, nausea, vomiting, diarrhea, constipation, headache, fevers, chills.  Discharge Exam: Vitals:   07/16/18 0852 07/16/18 1103  BP: 109/64 93/82  Pulse: 73 67  Resp: 15 18  Temp: 98.9 F (37.2 C) 98.4 F (36.9 C)  SpO2: 100% 100%   Vitals:   07/16/18 0000 07/16/18 0538 07/16/18 0852 07/16/18 1103  BP: (!) 106/56 (!) 104/50 109/64 93/82  Pulse: 64 62 73 67  Resp: 16 16 15 18   Temp: 98.2 F (36.8 C) 98.2 F (36.8 C) 98.9 F (37.2 C) 98.4 F (36.9 C)  TempSrc: Oral Oral Oral Oral  SpO2: 100% 100% 100% 100%  Weight:      Height:        General:  Pleasantly resting in bed, No acute distress. HEENT:  Normocephalic atraumatic.  Sclerae nonicteric, noninjected.  Extraocular movements intact bilaterally. Neck:  Without mass or deformity.  Trachea is midline. Lungs:  Clear to auscultate bilaterally without rhonchi, wheeze, or rales. Heart:  Regular rate and rhythm.  Without murmurs, rubs, or gallops. Abdomen:  Soft, nontender, nondistended.  Without  guarding or rebound. Extremities: Without cyanosis, clubbing, edema, or obvious deformity. Vascular:  Dorsalis pedis and posterior tibial pulses palpable bilaterally. Skin:  Warm and dry, no erythema, no ulcerations.    The results of significant diagnostics from this hospitalization (including imaging, microbiology, ancillary and laboratory) are listed below for reference.     Microbiology: Recent Results (from the past 240 hour(s))  SARS Coronavirus 2 (CEPHEID - Performed in  [facility] hospital lab), Hosp Order     Status: None   Collection Time: 07/09/18  9:29 AM  Result Value Ref Range Status   SARS Coronavirus 2 NEGATIVE NEGATIVE Final    Comment: (NOTE) If result is NEGATIVE SARS-CoV-2 target nucleic acids are NOT DETECTED. The SARS-CoV-2 RNA is generally detectable in upper and lower  respiratory specimens during the acute phase of infection. The lowest  concentration of SARS-CoV-2 viral copies this assay can detect is 250  copies / mL. A negative result does not preclude SARS-CoV-2 infection  and should not be used as the sole basis for treatment or other  patient management decisions.  A negative result may occur with  improper specimen collection / handling, submission of specimen other  than nasopharyngeal swab, presence of viral mutation(s) within the  areas targeted by this assay, and inadequate number of viral copies  (<250 copies / mL). A negative result must be combined with clinical  observations, patient history, and epidemiological information. If result is POSITIVE SARS-CoV-2 target nucleic acids are DETECTED. The SARS-CoV-2 RNA is generally detectable in upper and lower  respiratory specimens dur ing the acute phase of infection.  Positive  results are indicative of active infection with SARS-CoV-2.  Clinical  correlation with patient history and other diagnostic information is  necessary to determine patient infection status.  Positive results do  not rule out bacterial infection or co-infection with other viruses. If result is PRESUMPTIVE POSTIVE SARS-CoV-2 nucleic acids MAY BE PRESENT.   A presumptive positive result was obtained on the submitted specimen  and confirmed on repeat testing.  While 2019 novel coronavirus  (SARS-CoV-2) nucleic acids may be present in the submitted sample  additional confirmatory testing may be necessary for epidemiological  and / or clinical management purposes  to differentiate between  SARS-CoV-2  and other Sarbecovirus currently known to infect humans.  If clinically indicated additional testing with an alternate test  methodology [phone]) is advised. The SARS-CoV-2 RNA is generally  detectable in upper and lower respiratory sp ecimens during the acute  phase of infection. The expected result is Negative. Fact Sheet for Patients:  BoilerBrush.com.cy Fact Sheet for Healthcare Providers: https://pope.com/ This test is not yet approved or cleared by the [place] FDA and has been authorized for detection and/or diagnosis of SARS-CoV-2 by FDA under an Emergency Use Authorization (EUA).  This EUA will remain in effect (meaning this test can be used) for the duration of the COVID-19 declaration under Section 564(b)(1) of the Act, 21 U.S.C. section 360bbb-3(b)(1), unless the authorization is terminated or revoked sooner. Performed at [facility], [address] [place], [place] [address]   Culture, blood (routine x 2)     Status: None   Collection Time: 07/09/18 12:50 PM  Result Value Ref Range Status   Specimen Description BLOOD CENTRAL LINE  Final   Special Requests   Final    BOTTLES DRAWN AEROBIC AND ANAEROBIC Blood Culture adequate volume   Culture   Final    NO GROWTH 5 DAYS Performed at  [facility], [address] [place], [place] [address]    Report Status 07/14/2018 FINAL  Final  Culture, blood (routine x 2)     Status: None   Collection Time: 07/09/18  1:00 PM  Result Value Ref Range Status   Specimen Description BLOOD LEFT HAND  Final   Special Requests   Final    BOTTLES DRAWN AEROBIC AND ANAEROBIC Blood Culture adequate volume   Culture   Final    NO GROWTH 5 DAYS Performed at [facility], [address] [place], [place] [address]    Report Status 07/14/2018 FINAL  Final  Culture, respiratory (non-expectorated)     Status: None   Collection Time: 07/10/18  8:17 AM  Result Value Ref  Range Status   Specimen Description TRACHEAL ASPIRATE  Final   Special Requests NONE  Final   Gram Stain   Final    RARE WBC PRESENT, PREDOMINANTLY PMN RARE GRAM POSITIVE COCCI IN CHAINS RARE GRAM NEGATIVE RODS    Culture   Final    FEW Consistent with normal respiratory flora. Performed at [facility], [address] [place], [place] [address]    Report Status 07/12/2018 FINAL  Final  MRSA PCR Screening     Status: None   Collection Time: 07/11/18  9:40 AM  Result Value Ref Range Status   MRSA by PCR NEGATIVE NEGATIVE Final    Comment:        The GeneXpert MRSA Assay (FDA approved for NASAL specimens only), is one component of a comprehensive MRSA colonization surveillance program. It is not intended to diagnose MRSA infection nor to guide or monitor treatment for MRSA infections. Performed at [facility], [address] [place], [place] [address]   CSF culture     Status: None   Collection Time: 07/11/18 10:15 AM  Result Value Ref Range Status   Specimen Description CSF  Final   Special Requests NONE  Final   Gram Stain   Final    CYTOSPIN SMEAR WBC PRESENT, PREDOMINANTLY MONONUCLEAR NO ORGANISMS SEEN    Culture   Final    NO GROWTH 3 DAYS Performed at [facility], [address] [place], [place] [address]    Report Status 07/14/2018 FINAL  Final     Labs: BNP (last 3 results) Recent Labs    07/09/18 1424  BNP 31.4   Basic Metabolic Panel: Recent Labs  Lab 07/11/18 0910 07/11/18 1141 07/11/18 1643 07/12/18 0500 07/12/18 1644 07/14/18 0227 07/15/18 0222 07/16/18 0500  NA 137 138  --  137  --  137 139 139  K 3.8 3.5  --  3.0*  --  3.8 3.3* 3.2*  CL 103  --   --  101  --  106 106 103  CO2 24  --   --  26  --  21* 22 25  GLUCOSE 95  --   --  115*  --  94 95 89  BUN 15  --   --  14  --  13 13 19   CREATININE 0.94  --   --  0.81  --  0.76 0.77 0.87  CALCIUM 8.4*  --   --  8.4*  --  8.5* 8.7* 8.7*  MG 2.1  --  1.8 1.7 1.7  --   1.7  --   PHOS 2.6  --  1.6* 1.8* 3.1  --  4.0  --  Liver Function Tests: No results for input(s): AST, ALT, ALKPHOS, BILITOT, PROT, ALBUMIN in the last 168 hours. No results for input(s): LIPASE, AMYLASE in the last 168 hours. Recent Labs  Lab 07/13/18 0151  AMMONIA 36*   CBC: Recent Labs  Lab 07/12/18 0500 07/13/18 0151 07/14/18 0227 07/15/18 0222 07/16/18 0500  WBC 4.0 5.2 7.4 8.2 6.7  HGB 11.1* 12.1* 12.9* 13.0 13.9  HCT 32.8* 35.2* 37.1* 37.7* 40.0  MCV 88.2 86.7 87.1 86.5 87.1  PLT 126* 127* 194 209 253   Cardiac Enzymes: Recent Labs  Lab 07/09/18 1424  TROPONINI 0.03*   BNP: Invalid input(s): POCBNP CBG: Recent Labs  Lab 07/11/18 1933 07/11/18 2312 07/12/18 0318 07/12/18 0736 07/12/18 1139  GLUCAP 90 111* 111* 133* 125*   D-Dimer No results for input(s): DDIMER in the last 72 hours. Hgb A1c No results for input(s): HGBA1C in the last 72 hours. Lipid Profile No results for input(s): CHOL, HDL, LDLCALC, TRIG, CHOLHDL, LDLDIRECT in the last 72 hours. Thyroid function studies No results for input(s): TSH, T4TOTAL, T3FREE, THYROIDAB in the last 72 hours.  Invalid input(s): FREET3 Anemia work up No results for input(s): VITAMINB12, FOLATE, FERRITIN, TIBC, IRON, RETICCTPCT in the last 72 hours. Urinalysis    Component Value Date/Time   COLORURINE STRAW (A) 07/09/2018 0943   APPEARANCEUR CLEAR 07/09/2018 0943   LABSPEC 1.009 07/09/2018 0943   PHURINE 5.0 07/09/2018 0943   GLUCOSEU >=500 (A) 07/09/2018 0943   HGBUR SMALL (A) 07/09/2018 0943   BILIRUBINUR NEGATIVE 07/09/2018 0943   KETONESUR NEGATIVE 07/09/2018 0943   PROTEINUR NEGATIVE 07/09/2018 0943   NITRITE NEGATIVE 07/09/2018 0943   LEUKOCYTESUR NEGATIVE 07/09/2018 0943   Sepsis Labs Invalid input(s): PROCALCITONIN,  WBC,  LACTICIDVEN Microbiology Recent Results (from the past 240 hour(s))  SARS Coronavirus 2 (CEPHEID - Performed in [facility] hospital lab), Hosp Order     Status: None    Collection Time: 07/09/18  9:29 AM  Result Value Ref Range Status   SARS Coronavirus 2 NEGATIVE NEGATIVE Final    Comment: (NOTE) If result is NEGATIVE SARS-CoV-2 target nucleic acids are NOT DETECTED. The SARS-CoV-2 RNA is generally detectable in upper and lower  respiratory specimens during the acute phase of infection. The lowest  concentration of SARS-CoV-2 viral copies this assay can detect is 250  copies / mL. A negative result does not preclude SARS-CoV-2 infection  and should not be used as the sole basis for treatment or other  patient management decisions.  A negative result may occur with  improper specimen collection / handling, submission of specimen other  than nasopharyngeal swab, presence of viral mutation(s) within the  areas targeted by this assay, and inadequate number of viral copies  (<250 copies / mL). A negative result must be combined with clinical  observations, patient history, and epidemiological information. If result is POSITIVE SARS-CoV-2 target nucleic acids are DETECTED. The SARS-CoV-2 RNA is generally detectable in upper and lower  respiratory specimens dur ing the acute phase of infection.  Positive  results are indicative of active infection with SARS-CoV-2.  Clinical  correlation with patient history and other diagnostic information is  necessary to determine patient infection status.  Positive results do  not rule out bacterial infection or co-infection with other viruses. If result is PRESUMPTIVE POSTIVE SARS-CoV-2 nucleic acids MAY BE PRESENT.   A presumptive positive result was obtained on the submitted specimen  and confirmed on repeat testing.  While 2019 novel coronavirus  (SARS-CoV-2) nucleic acids  may be present in the submitted sample  additional confirmatory testing may be necessary for epidemiological  and / or clinical management purposes  to differentiate between  SARS-CoV-2 and other Sarbecovirus currently known to infect humans.   If clinically indicated additional testing with an alternate test  methodology [phone]) is advised. The SARS-CoV-2 RNA is generally  detectable in upper and lower respiratory sp ecimens during the acute  phase of infection. The expected result is Negative. Fact Sheet for Patients:  BoilerBrush.com.cy Fact Sheet for Healthcare Providers: https://pope.com/ This test is not yet approved or cleared by the [place] FDA and has been authorized for detection and/or diagnosis of SARS-CoV-2 by FDA under an Emergency Use Authorization (EUA).  This EUA will remain in effect (meaning this test can be used) for the duration of the COVID-19 declaration under Section 564(b)(1) of the Act, 21 U.S.C. section 360bbb-3(b)(1), unless the authorization is terminated or revoked sooner. Performed at [facility], [address], [place], [place] [address]   Culture, blood (routine x 2)     Status: None   Collection Time: 07/09/18 12:50 PM  Result Value Ref Range Status   Specimen Description BLOOD CENTRAL LINE  Final   Special Requests   Final    BOTTLES DRAWN AEROBIC AND ANAEROBIC Blood Culture adequate volume   Culture   Final    NO GROWTH 5 DAYS Performed at [facility], [address], [place], [place] [address]    Report Status 07/14/2018 FINAL  Final  Culture, blood (routine x 2)     Status: None   Collection Time: 07/09/18  1:00 PM  Result Value Ref Range Status   Specimen Description BLOOD LEFT HAND  Final   Special Requests   Final    BOTTLES DRAWN AEROBIC AND ANAEROBIC Blood Culture adequate volume   Culture   Final    NO GROWTH 5 DAYS Performed at [facility], [address], [place], [place] [address]    Report Status 07/14/2018 FINAL  Final  Culture, respiratory (non-expectorated)     Status: None   Collection Time: 07/10/18  8:17 AM  Result Value Ref Range Status   Specimen Description TRACHEAL ASPIRATE   Final   Special Requests NONE  Final   Gram Stain   Final    RARE WBC PRESENT, PREDOMINANTLY PMN RARE GRAM POSITIVE COCCI IN CHAINS RARE GRAM NEGATIVE RODS    Culture   Final    FEW Consistent with normal respiratory flora. Performed at [facility], [address] [place], [place] [address]    Report Status 07/12/2018 FINAL  Final  MRSA PCR Screening     Status: None   Collection Time: 07/11/18  9:40 AM  Result Value Ref Range Status   MRSA by PCR NEGATIVE NEGATIVE Final    Comment:        The GeneXpert MRSA Assay (FDA approved for NASAL specimens only), is one component of a comprehensive MRSA colonization surveillance program. It is not intended to diagnose MRSA infection nor to guide or monitor treatment for MRSA infections. Performed at [facility], [address] [place], [place] [address]   CSF culture     Status: None   Collection Time: 07/11/18 10:15 AM  Result Value Ref Range Status   Specimen Description CSF  Final   Special Requests NONE  Final   Gram Stain   Final    CYTOSPIN SMEAR WBC PRESENT, PREDOMINANTLY MONONUCLEAR  NO ORGANISMS SEEN    Culture   Final    NO GROWTH 3 DAYS Performed at [facility], [address], [place], [place] [address]    Report Status 07/14/2018 FINAL  Final     Time coordinating discharge: Over 30 minutes  SIGNED:  Azucena Fallen, DO [facility] 07/16/2018, 2:01 PM Pager   If 7PM-7AM, please contact night-coverage [email or web address] Password [email or web address]

## 2020-01-05 IMAGING — DX PORTABLE CHEST - 1 VIEW
1 series · 1 of 1 positions shown · non-contrast
Comparison: 07/14/2018

CLINICAL DATA: Status post chest tube

EXAM:
PORTABLE CHEST 1 VIEW

[chest ap]
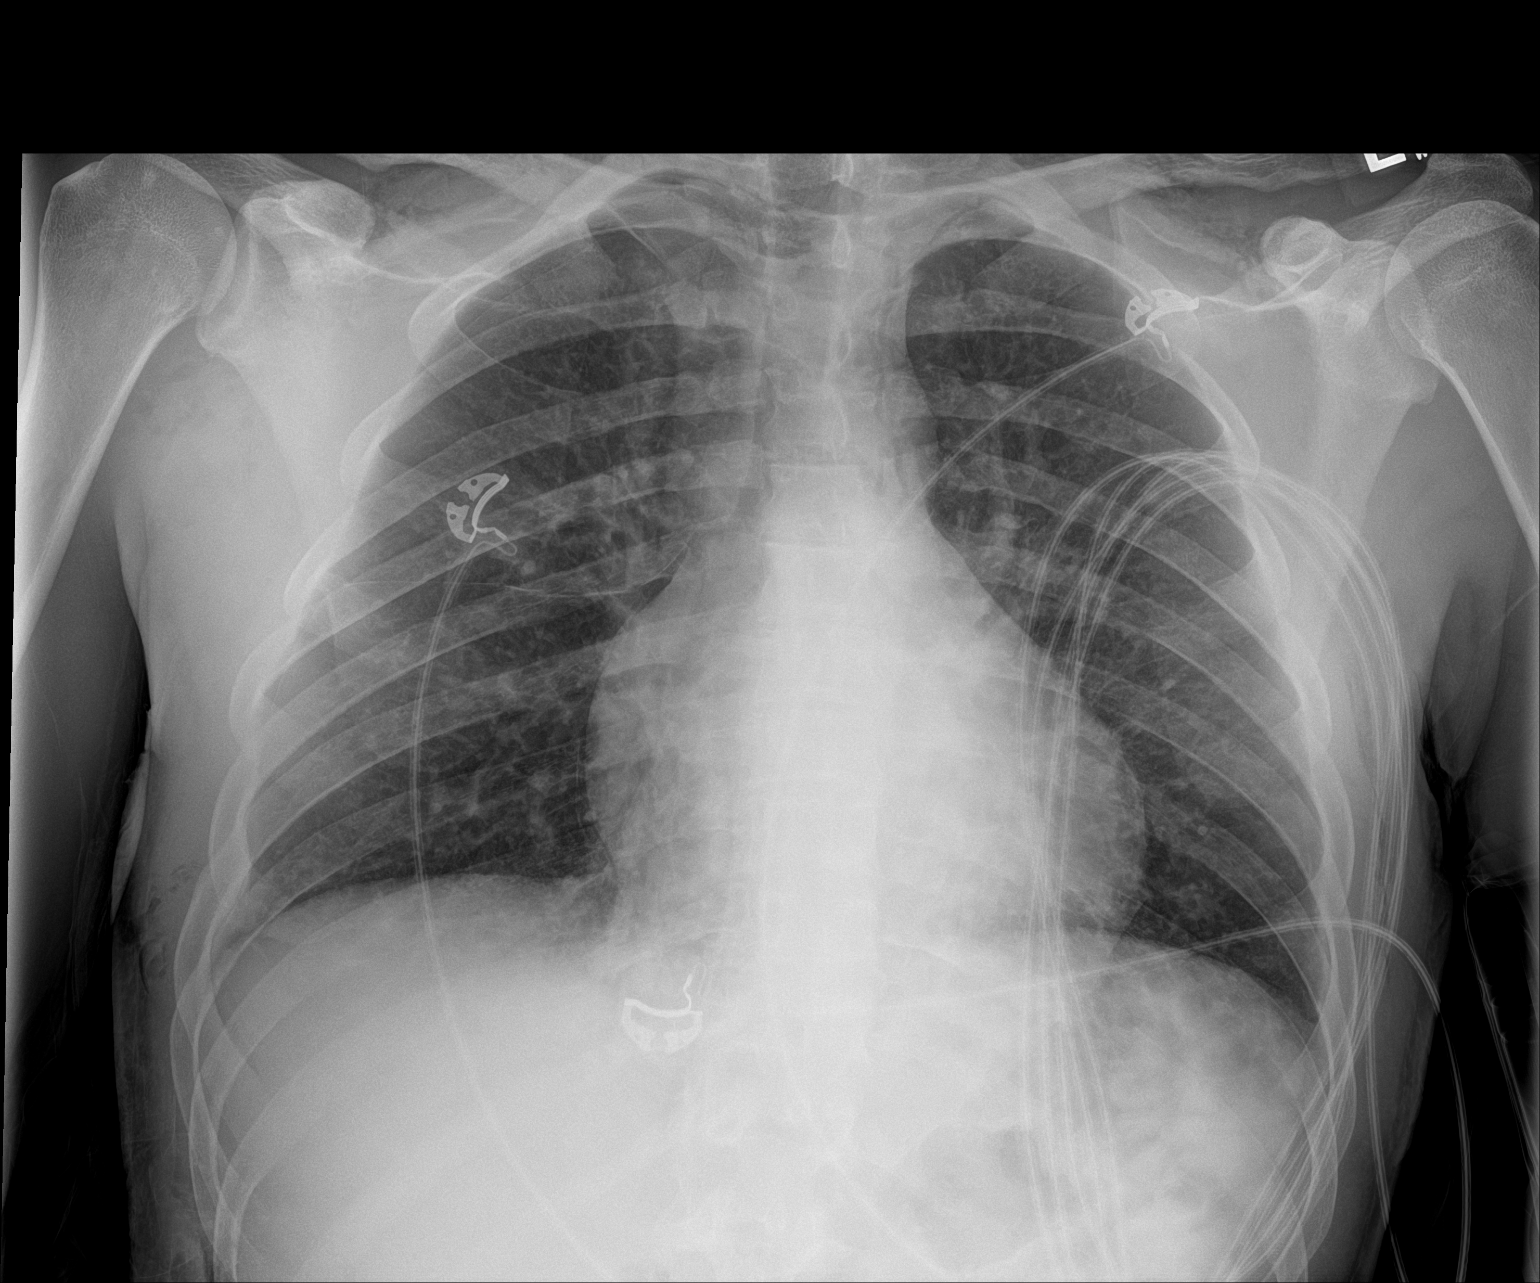

[1 of 1 positions shown; findings below may reference images not displayed]

FINDINGS: Right-sided chest tube has been removed in the interval. A small
right-sided pneumothorax is noted. No focal infiltrate is seen. No
bony abnormality is noted.
IMPRESSION: Small right-sided pneumothorax in the apex following chest tube
removal.
